# Patient Record
Sex: Female | Born: 1965 | Race: White | Hispanic: No | Marital: Married | Smoking: Current every day smoker
Health system: Southern US, Community
[De-identification: ages and names within clinical notes are randomized; demographics above are authoritative.]

## PROBLEM LIST (undated history)

## (undated) DIAGNOSIS — E785 Hyperlipidemia, unspecified: Secondary | ICD-10-CM

## (undated) DIAGNOSIS — M1990 Unspecified osteoarthritis, unspecified site: Secondary | ICD-10-CM

## (undated) DIAGNOSIS — E079 Disorder of thyroid, unspecified: Secondary | ICD-10-CM

## (undated) DIAGNOSIS — Z720 Tobacco use: Secondary | ICD-10-CM

## (undated) DIAGNOSIS — K219 Gastro-esophageal reflux disease without esophagitis: Secondary | ICD-10-CM

## (undated) DIAGNOSIS — U071 COVID-19: Secondary | ICD-10-CM

## (undated) DIAGNOSIS — F419 Anxiety disorder, unspecified: Secondary | ICD-10-CM

## (undated) HISTORY — DX: Hyperlipidemia, unspecified: E78.5

## (undated) HISTORY — DX: COVID-19: U07.1

## (undated) HISTORY — PX: OVARIAN CYST REMOVAL: SHX89

## (undated) HISTORY — DX: Disorder of thyroid, unspecified: E07.9

## (undated) HISTORY — DX: Gastro-esophageal reflux disease without esophagitis: K21.9

## (undated) HISTORY — PX: BREAST BIOPSY: SHX20

## (undated) HISTORY — DX: Tobacco use: Z72.0

## (undated) HISTORY — DX: Unspecified osteoarthritis, unspecified site: M19.90

## (undated) HISTORY — DX: Anxiety disorder, unspecified: F41.9

---

## 1998-09-11 HISTORY — PX: BREAST SURGERY: SHX581

## 2004-06-23 ENCOUNTER — Ambulatory Visit: Payer: Self-pay

## 2007-09-12 HISTORY — PX: SALPINGOOPHORECTOMY: SHX82

## 2008-02-17 ENCOUNTER — Ambulatory Visit: Payer: Self-pay

## 2008-02-18 ENCOUNTER — Ambulatory Visit: Payer: Self-pay

## 2008-02-20 ENCOUNTER — Emergency Department: Payer: Self-pay | Admitting: Emergency Medicine

## 2012-03-27 LAB — HM PAP SMEAR: HM PAP: NEGATIVE

## 2013-05-08 ENCOUNTER — Ambulatory Visit (INDEPENDENT_AMBULATORY_CARE_PROVIDER_SITE_OTHER)
Admission: RE | Admit: 2013-05-08 | Discharge: 2013-05-08 | Disposition: A | Discharge status: Home / Self Care | Payer: BC Managed Care – PPO | Source: Ambulatory Visit | Attending: Adult Health | Admitting: Adult Health

## 2013-05-08 ENCOUNTER — Encounter: Payer: Self-pay | Admitting: Adult Health

## 2013-05-08 ENCOUNTER — Ambulatory Visit (INDEPENDENT_AMBULATORY_CARE_PROVIDER_SITE_OTHER): Payer: BC Managed Care – PPO | Admitting: Adult Health

## 2013-05-08 VITALS — BP 106/60 | HR 93 | Temp 98.3°F | Resp 12 | Ht 64.5 in | Wt 130.5 lb

## 2013-05-08 DIAGNOSIS — Z7189 Other specified counseling: Secondary | ICD-10-CM

## 2013-05-08 DIAGNOSIS — Z0000 Encounter for general adult medical examination without abnormal findings: Secondary | ICD-10-CM

## 2013-05-08 DIAGNOSIS — Z716 Tobacco abuse counseling: Secondary | ICD-10-CM

## 2013-05-08 DIAGNOSIS — R202 Paresthesia of skin: Secondary | ICD-10-CM

## 2013-05-08 DIAGNOSIS — M25519 Pain in unspecified shoulder: Secondary | ICD-10-CM

## 2013-05-08 DIAGNOSIS — R200 Anesthesia of skin: Secondary | ICD-10-CM

## 2013-05-08 DIAGNOSIS — M25511 Pain in right shoulder: Secondary | ICD-10-CM

## 2013-05-08 DIAGNOSIS — R209 Unspecified disturbances of skin sensation: Secondary | ICD-10-CM

## 2013-05-08 HISTORY — DX: Anesthesia of skin: R20.0

## 2013-05-08 HISTORY — DX: Anesthesia of skin: R20.2

## 2013-05-08 HISTORY — DX: Encounter for general adult medical examination without abnormal findings: Z00.00

## 2013-05-08 HISTORY — DX: Pain in right shoulder: M25.511

## 2013-05-08 HISTORY — DX: Tobacco abuse counseling: Z71.6

## 2013-05-08 LAB — CBC WITH DIFFERENTIAL/PLATELET
Basophils Relative: 0.3 % (ref 0.0–3.0)
Eosinophils Relative: 0.9 % (ref 0.0–5.0)
HCT: 39.5 % (ref 36.0–46.0)
Lymphs Abs: 1.8 10*3/uL (ref 0.7–4.0)
MCV: 97 fl (ref 78.0–100.0)
Monocytes Absolute: 0.7 10*3/uL (ref 0.1–1.0)
Platelets: 218 10*3/uL (ref 150.0–400.0)
WBC: 12.1 10*3/uL — ABNORMAL HIGH (ref 4.5–10.5)

## 2013-05-08 LAB — LIPID PANEL
Cholesterol: 180 mg/dL (ref 0–200)
LDL Cholesterol: 117 mg/dL — ABNORMAL HIGH (ref 0–99)
Total CHOL/HDL Ratio: 4

## 2013-05-08 LAB — BASIC METABOLIC PANEL
Chloride: 103 mEq/L (ref 96–112)
Potassium: 4.3 mEq/L (ref 3.5–5.1)

## 2013-05-08 LAB — HEPATIC FUNCTION PANEL
ALT: 12 U/L (ref 0–35)
AST: 14 U/L (ref 0–37)
Alkaline Phosphatase: 50 U/L (ref 39–117)
Bilirubin, Direct: 0.1 mg/dL (ref 0.0–0.3)
Total Bilirubin: 0.5 mg/dL (ref 0.3–1.2)

## 2013-05-08 IMAGING — CR DG SHOULDER 2+V*R*
3 series · 3 of 3 positions shown · non-contrast
Comparison: None.

CLINICAL DATA: Right shoulder pain for 8 months, no known injury

RIGHT SHOULDER - 2+ VIEW

[view not recorded (1 of 3)]
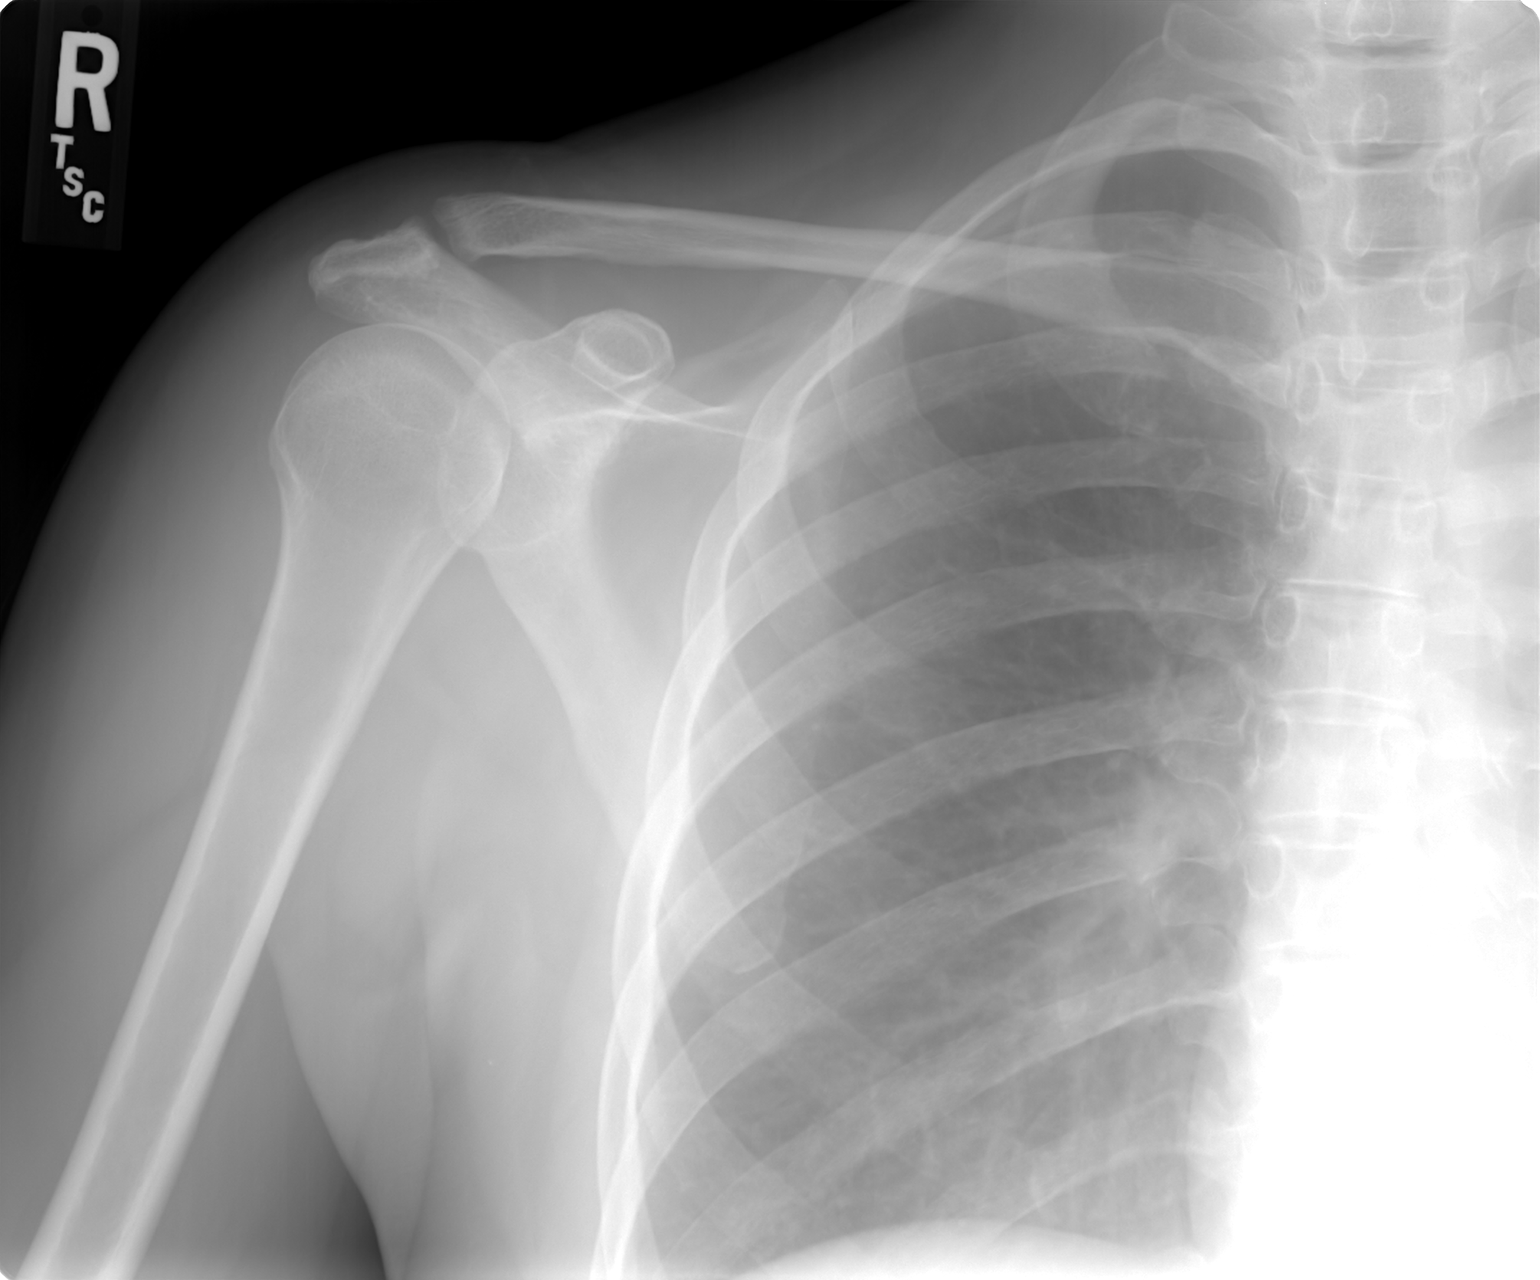

[view not recorded (2 of 3)]
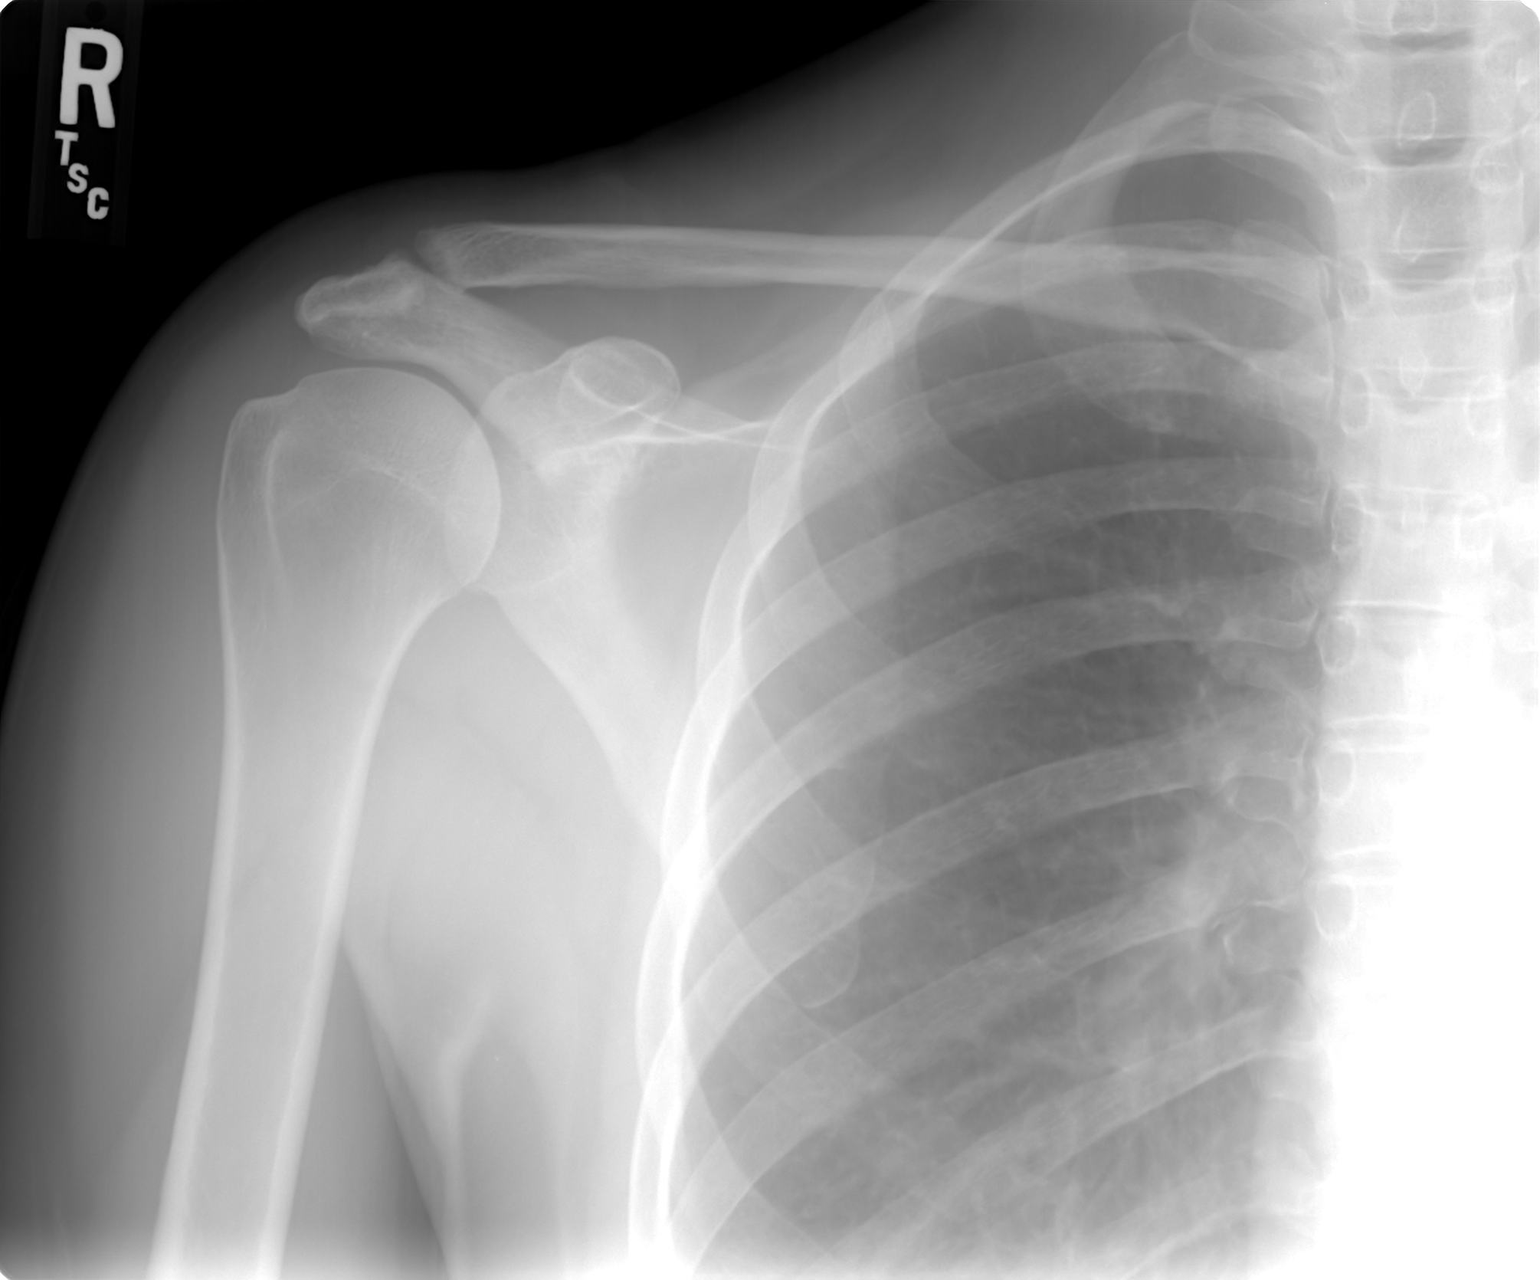

[view not recorded (3 of 3)]
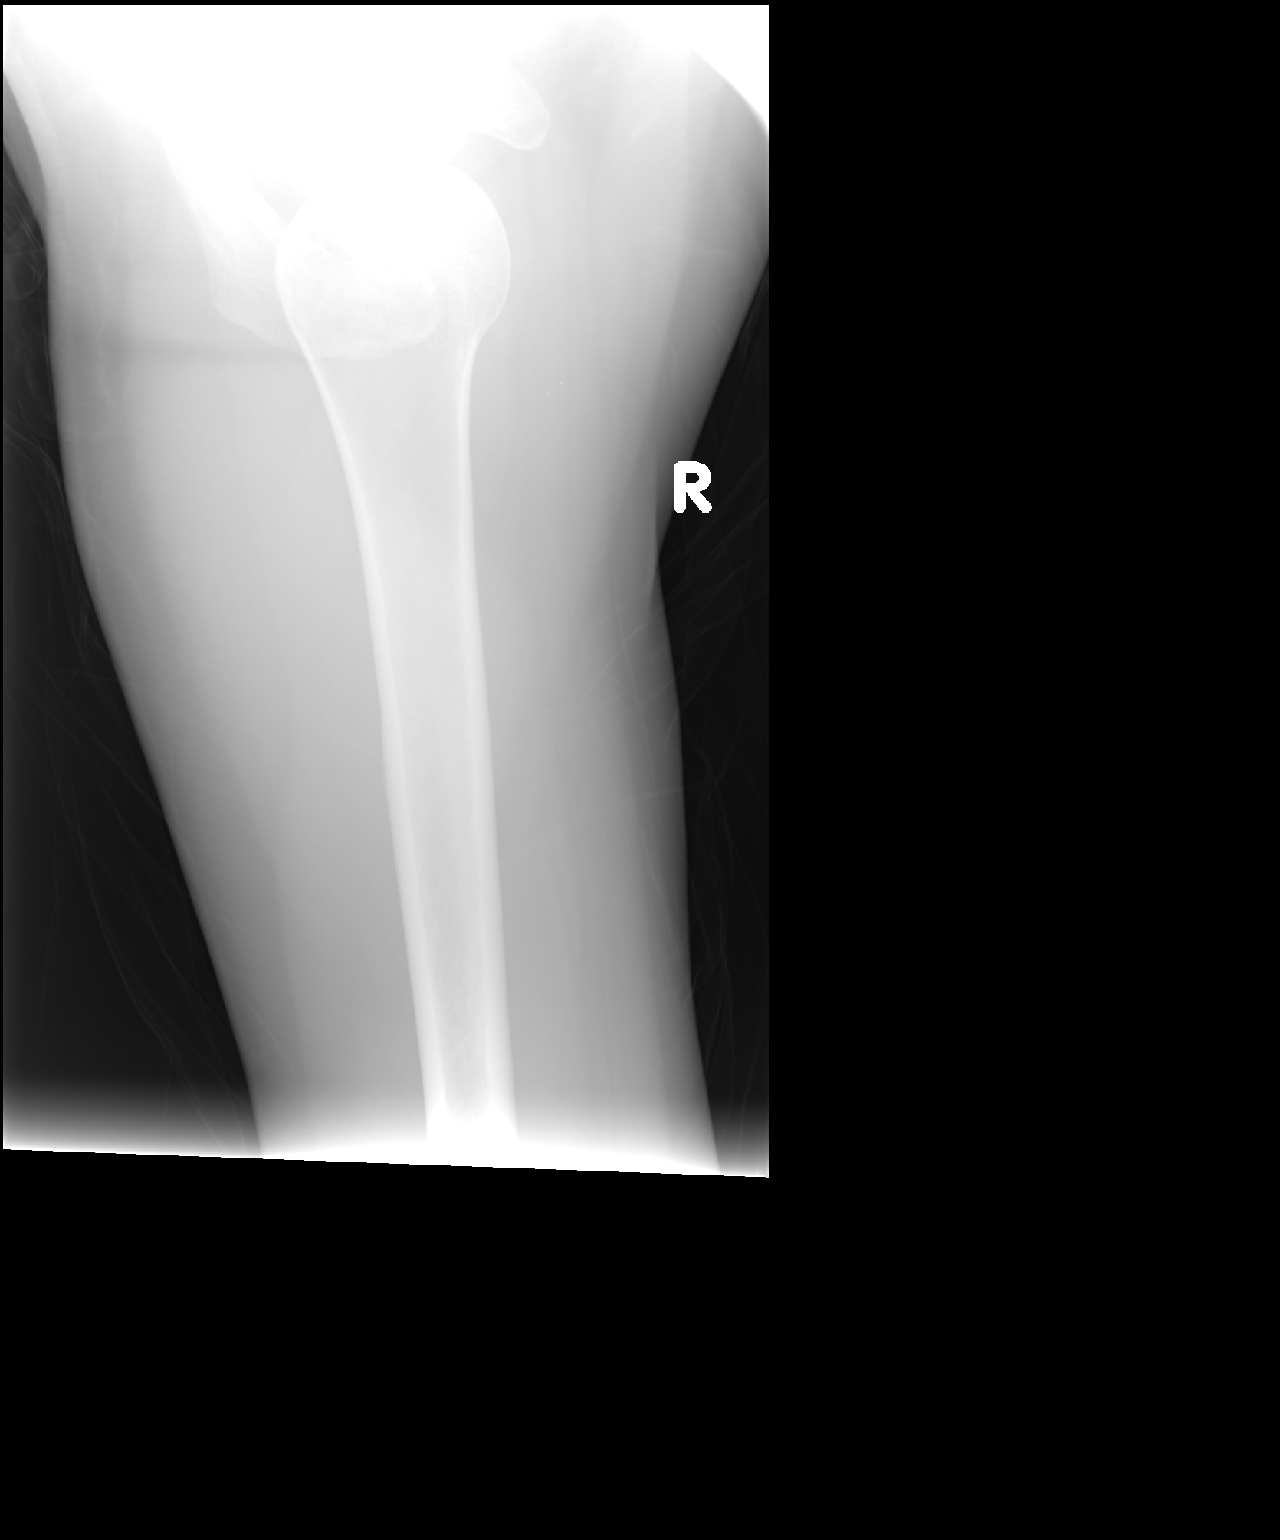

[3 of 3 positions shown; findings below may reference images not displayed]

FINDINGS: Three views of the right shoulder submitted.  No acute
fracture or subluxation.  Glenohumeral joint is preserved.  Minimal
degenerative changes acromioclavicular joint.
IMPRESSION: No acute fracture or subluxation.  Minimal degenerative changes
acromioclavicular joint.

## 2013-05-08 MED ORDER — VARENICLINE TARTRATE 0.5 MG X 11 & 1 MG X 42 PO MISC: ORAL | Status: DC

## 2013-05-08 NOTE — Assessment & Plan Note (Signed)
Discussed importance of smoking cessation for overall health. She is ready to quit. Provided prescription for Chantix. Continue to encourage.

## 2013-05-08 NOTE — Progress Notes (Signed)
  Subjective:    Patient ID: Megan Poole, female    DOB: May 17, 1966, 47 y.o.   MRN: [id number]  HPI  Correction: numbness and tingling of the left upper extremity  Review of Systems  Neurological: Positive for numbness.       Numbness and tingling of the left upper extremity. Sometimes numbness of the index finger.       Objective:   Physical Exam        Assessment & Plan:

## 2013-05-08 NOTE — Patient Instructions (Addendum)
   Thank you for choosing [facility] for your health care needs.  Please have your labs drawn prior to leaving the office.  The results will be available through MyChart for your convenience. Please remember to activate this. The activation code is located at the end of this form.  Please go to our [facility] for your right shoulder xray. I will notify you of results once they are available.  Call [facility] to find out when your mammogram is due.  I am providing you with prescription to Chantix  I will request your medical records from Dr. Luella Cook.  Please call with any questions or concerns.  Bursitis Bursitis is a swelling and soreness (inflammation) of a fluid-filled sac (bursa) that overlies and protects a joint. It can be caused by injury, overuse of the joint, arthritis or infection. The joints most likely to be affected are the elbows, shoulders, hips and knees. HOME CARE INSTRUCTIONS   Apply ice to the affected area for 15-20 minutes each hour while awake for 2 days. Put the ice in a plastic bag and place a towel between the bag of ice and your skin.  Rest the injured joint as much as possible, but continue to put the joint through a full range of motion, 4 times per day. (The shoulder joint especially becomes rapidly "frozen" if not used.) When the pain lessens, begin normal slow movements and usual activities.  Only take over-the-counter or prescription medicines for pain, discomfort or fever as directed by your caregiver.  Your caregiver may recommend draining the bursa and injecting medicine into the bursa. This may help the healing process.  Follow all instructions for follow-up with your caregiver. This includes any orthopedic referrals, physical therapy and rehabilitation. Any delay in obtaining necessary care could result in a delay or failure of the bursitis to heal and chronic pain. SEEK IMMEDIATE MEDICAL CARE IF:   Your pain  increases even during treatment.  You develop an oral temperature above 102 F (38.9 C) and have heat and inflammation over the involved bursa. MAKE SURE YOU:   Understand these instructions.  Will watch your condition.  Will get help right away if you are not doing well or get worse. Document Released: 08/25/2000 Document Revised: 11/20/2011 Document Reviewed: 07/30/2009 [facility] Patient Information 2014 [place], [place].

## 2013-05-08 NOTE — Assessment & Plan Note (Addendum)
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 Negative Tinel and Phalen. Appears to be radial nerve involvement. Uncertain if just at elbow or if C6 may be impacting. Refer to Neurology for EMG testing.

## 2013-05-08 NOTE — Assessment & Plan Note (Addendum)
Normal physical exam excluding pelvic and breast other than problems stated separately. Labs: cbc w/diff, bmet, hepatic panel, tsh, B12, vit d, lipids. Request previous medical records.

## 2013-05-08 NOTE — Progress Notes (Signed)
Subjective:    Patient ID: Megan Poole, female    DOB: Jan 27, 1966, 47 y.o.   MRN: [id number]  HPI  Patient is a pleasant 47 y/o female who presents to clinic to establish care. She has not had a PCP in several years. She was mainly followed by her OB/GYN, Dr. Luella Cook. We will request her medical records. Overall she is feeling well. She has a few concerns.  1. She has been experiencing right shoulder pain since the beginning of the year. She reports the pain is worse in the evening. She has not had this evaluated. ROM is decreased. She reports pain with lifting arm forward. She does not recall any injury to the area that she can recall. She rides horses. Pt is right handed. The pain is localized to the shoulder area. Occasionally takes ibuprofen. Denies tingling or numbness of the right arm.   2. She has left lower extremity tingling and numbness. This does not wake her up at night. 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 She occasionally feels numbness in the index finger. Denies swelling, redness or other symptom. Pt reports tightness in the neck area - no pain.   Review of Systems  Constitutional: Negative for fever, chills and fatigue.  HENT: Negative.   Eyes: Negative.        Wears reading glasses. Need eye exam. Has not had dilated exam in years.  Respiratory: Negative for cough, chest tightness, shortness of breath and wheezing.   Cardiovascular: Negative for palpitations and leg swelling.       Has felt a burning and heaviness in her chest. Lasted ~ 15 min. She did not recall pain elsewhere at the time.  Gastrointestinal: Negative.   Endocrine: Negative.   Genitourinary: Negative for dysuria, urgency, frequency, hematuria, flank pain, difficulty urinating, vaginal pain, menstrual problem and pelvic pain.  Musculoskeletal:       Right shoulder constant pain since January 2014. Aches worse at the end of the day. She does not recall  any injury to the area. She has full range of motion but cannot move it quickly.  Skin: Negative.   Allergic/Immunologic:       Seasonal allergies - may trigger some sinus "problems". Mainly during spring and summer.  Neurological: Positive for numbness. Negative for dizziness, tremors, seizures, syncope, weakness, light-headedness and headaches.       Tingling and numbness of left lower extremity. Sometimes feels the index finger go numb. Feels tingling constant. Does not wake her up at night.   Hematological: Negative.   Psychiatric/Behavioral: Negative.     BP 106/60  Pulse 93  Temp(Src) 98.3 F (36.8 C) (Oral)  Resp 12  Ht 5' 4.5" (1.638 m)  Wt 130 lb 8 oz (59.194 kg)  BMI 22.06 kg/m2  SpO2 99%  LMP 05/07/2013    Objective:   Physical Exam  Constitutional: She is oriented to person, place, and time. She appears well-developed and well-nourished. No distress.  HENT:  Head: Normocephalic and atraumatic.  Right Ear: External ear normal.  Left Ear: External ear normal.  Nose: Nose normal.  Mouth/Throat: Oropharynx is clear and moist.  Eyes: Conjunctivae and EOM are normal. Pupils are equal, round, and reactive to light.  Neck: Normal range of motion. Neck supple. No tracheal deviation present. No thyromegaly present.  Cardiovascular: Normal rate, regular rhythm, normal heart sounds and intact distal pulses.  Exam reveals no gallop and no friction rub.   No  murmur heard. Pulmonary/Chest: Effort normal and breath sounds normal. No respiratory distress. She has no wheezes. She has no rales. She exhibits no tenderness.  Abdominal: Soft. Bowel sounds are normal. She exhibits no distension and no mass. There is no tenderness. There is no rebound and no guarding.  Musculoskeletal: Normal range of motion. She exhibits no edema and no tenderness.  Lymphadenopathy:    She has no cervical adenopathy.  Neurological: She is alert and oriented to person, place, and time. She has normal  reflexes. She displays normal reflexes. No cranial nerve deficit. She exhibits normal muscle tone. Coordination normal.  Reflex Scores:      Brachioradialis reflexes are 2+ on the right side and 2+ on the left side.      Patellar reflexes are 2+ on the right side and 2+ on the left side. Negative Tinel & Phalen  Skin: Skin is warm and dry.  Psychiatric: She has a normal mood and affect. Her behavior is normal. Judgment and thought content normal.       Assessment & Plan:

## 2013-05-08 NOTE — Assessment & Plan Note (Addendum)
Suspect this is bursitis. Check xray to r/o any other bony abnormality. Ice, rest, NSAIDs. If no improvement will refer to ortho.

## 2013-05-09 ENCOUNTER — Encounter: Payer: Self-pay | Admitting: *Deleted

## 2013-10-09 ENCOUNTER — Ambulatory Visit (INDEPENDENT_AMBULATORY_CARE_PROVIDER_SITE_OTHER): Payer: BC Managed Care – PPO | Admitting: Adult Health

## 2013-10-09 ENCOUNTER — Encounter: Payer: Self-pay | Admitting: Adult Health

## 2013-10-09 VITALS — BP 108/70 | HR 96 | Resp 12 | Wt 133.0 lb

## 2013-10-09 DIAGNOSIS — R079 Chest pain, unspecified: Secondary | ICD-10-CM

## 2013-10-09 NOTE — Assessment & Plan Note (Signed)
[facility] that occurred around 2 am. Workup was normal for cardiac. However, pt has a family hx of CAD (father) and she has a 32 pack year hx of smoking. Her LDL was slightly elevated. Discussed having stress test. Will refer to Dr. Kirke CorinArida or Dr. Mariah MillingGollan for further evaluation and recommendations. Pt is active but does not exercise.

## 2013-10-09 NOTE — Progress Notes (Signed)
Pre visit review using our clinic review tool, if applicable. No additional management support is needed unless otherwise documented below in the visit note. 

## 2013-10-09 NOTE — Progress Notes (Signed)
   Subjective:    Patient ID: Megan Poole, female    DOB: February 28, 1966, 48 y.o.   MRN: [id number]  HPI  Pt is a pleasant 48 y/o female who [facility] for mid sternal chest pain. She reports pain started around 2 am. After workup was negative for cardiac, it was determined to be M/S in nature. Also has some stomach problems occasionally with reflux. May have also been spasms of the esophagus. Symptoms have subsided completely. Recommendation was to follow up with PCP and possible stress test. She does not have any personal cardiac hx but does have strong family hx of cardiac dz. Ongoing tobacco abuse. She has a 32 year pack history. LDL was slightly elevated. She is not overweight.   Review of Systems  Constitutional: Negative.   HENT: Negative.   Respiratory: Negative.   Cardiovascular: Negative for chest pain, palpitations and leg swelling.  Gastrointestinal:       Occasional reflux   Genitourinary: Negative.   Neurological: Negative.   Hematological: Negative.   Psychiatric/Behavioral: Negative.   All other systems reviewed and are negative.       Objective:   Physical Exam  Constitutional: She is oriented to person, place, and time. She appears well-developed and well-nourished. No distress.  Cardiovascular: Normal rate, regular rhythm and normal heart sounds.  Exam reveals no gallop and no friction rub.   No murmur heard. Pulmonary/Chest: Effort normal and breath sounds normal. No respiratory distress. She has no wheezes. She has no rales. She exhibits no tenderness.  Neurological: She is alert and oriented to person, place, and time.  Skin: Skin is warm and dry.  Psychiatric: She has a normal mood and affect. Her behavior is normal. Judgment and thought content normal.          Assessment & Plan:

## 2013-10-10 ENCOUNTER — Telehealth: Payer: Self-pay | Admitting: Adult Health

## 2013-10-10 NOTE — Telephone Encounter (Signed)
Relevant patient education assigned to patient using Emmi. ° °

## 2013-10-15 ENCOUNTER — Encounter: Payer: Self-pay | Admitting: Cardiovascular Disease

## 2013-10-15 ENCOUNTER — Ambulatory Visit (INDEPENDENT_AMBULATORY_CARE_PROVIDER_SITE_OTHER): Payer: BC Managed Care – PPO | Admitting: Cardiovascular Disease

## 2013-10-15 VITALS — BP 100/78 | HR 96 | Ht 66.0 in | Wt 135.5 lb

## 2013-10-15 DIAGNOSIS — R079 Chest pain, unspecified: Secondary | ICD-10-CM

## 2013-10-15 NOTE — Patient Instructions (Signed)
Your physician recommends that you schedule a follow-up appointment in: 3 months.  

## 2013-10-15 NOTE — Assessment & Plan Note (Signed)
She presents with several episodes of chest pain that occurred 2-3 weeks ago. The pains have  not recurred. The chest pains were somewhat atypical in that it woke her up in the middle of the night. She went to the emergency room and her workup was unremarkable. She's not had any recurrent pain.  She was diagnosed as having MSK pain   She does smoke and has a positive family history of coronary disease.  At this point, I do not think that a stress test would show us much. Her resting EKG is normal. She does not have any exertional symptoms.  I have advised her to start a regular exercise program. If she is able to progress in her exercise regimen, then I do not think that we need to do a stress test.  On the other hand, if she develops episodes of chest pain or chest tightness with any sort of exercise regimen, then we should proceed with further evaluation for ischemia.  Her father has a history of coronary artery disease and has had several PCI's. He also has a history of lung cancer. I strongly encouraged her to stop smoking.

## 2013-10-15 NOTE — Progress Notes (Signed)
     Megan Poole Date of Birth  [birth year]       [facility]    [place] [facility] [address], [address]  [address] [place], [place]  [address]   [place]  [id number] [phone]     [phone]   Fax  [phone]    Fax [phone]  Problem List: 1. Chest pain  History of Present Illness:  Megan Poole presents for further eval of CP.  Occurred Jan. 18 and 19th.  Woke her up in the middle of the night.  2 Am.   The pain last 10 minutes.    Went to ER.  Evaluation was negative.    1 ppd smoker.    She is active - does not do regular exercise.  She has not cut back on her smoking yet.   She has not started chantix yet.  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   + family hx of CAD - father with CAD, smoker.  Also has lung cancer.     Current Outpatient Prescriptions  Medication Sig Dispense Refill  . ibuprofen (ADVIL,MOTRIN) 200 MG tablet Take 400 mg by mouth every 6 (six) hours as needed.      . CHANTIX STARTING MONTH PAK 0.5 MG X 11 & 1 MG X 42 tablet Take by mouth as needed.        No current facility-administered medications for this visit.     No Known Allergies  History reviewed. No pertinent past medical history.  Past Surgical History  Procedure Laterality Date  . Breast surgery Right 2000    biopsy - benign  . Salpingoophorectomy Right 2009    ovarian cyst    History  Smoking status  . Current Every Day Smoker -- 1.00 packs/day for 30 years  . Types: Cigarettes  Smokeless tobacco  . Never Used    History  Alcohol Use No    Comment: occasional use    Family History  Problem Relation Age of Onset  . Heart disease Father [id number]    CAD   . Cancer Father     lung  . Cancer Sister     cervical cancer  . Hypertension Paternal Grandmother     Reviw of Systems:  Reviewed in the HPI.  All other systems are negative.  Physical Exam: Blood pressure 100/78, pulse 96, height 5\' 6"  (1.676 m), weight 135 lb 8 oz (61.462  kg). Wt Readings from Last 3 Encounters:  10/15/13 135 lb 8 oz (61.462 kg)  10/09/13 133 lb (60.328 kg)  05/08/13 130 lb 8 oz (59.194 kg)     General: Well developed, well nourished, in no acute distress.  Head: Normocephalic, atraumatic, sclera non-icteric, mucus membranes are moist,   Neck: Supple. Carotids are 2 + without bruits. No JVD   Lungs: Clear   Heart: RR, normal S1, S2  Abdomen: Soft, non-tender, non-distended with normal bowel sounds.  Msk:  Strength and tone are normal   Extremities: No clubbing or cyanosis. No edema.  Distal pedal pulses are 2+ and equal    Neuro: CN II - XII intact.  Alert and oriented X 3.   Psych:  Normal   ECG: 10/15/2013: Normal sinus rhythm at 96. EKG is normal.  Assessment / Plan:

## 2014-01-19 ENCOUNTER — Ambulatory Visit: Payer: BC Managed Care – PPO | Admitting: Cardiovascular Disease

## 2014-02-25 ENCOUNTER — Encounter: Payer: Self-pay | Admitting: *Deleted

## 2015-05-14 ENCOUNTER — Encounter (INDEPENDENT_AMBULATORY_CARE_PROVIDER_SITE_OTHER): Payer: Self-pay

## 2015-05-14 ENCOUNTER — Ambulatory Visit (INDEPENDENT_AMBULATORY_CARE_PROVIDER_SITE_OTHER): Payer: BC Managed Care – PPO | Admitting: Nurse Practitioner

## 2015-05-14 ENCOUNTER — Encounter: Payer: Self-pay | Admitting: Nurse Practitioner

## 2015-05-14 VITALS — BP 100/80 | HR 96 | Temp 98.3°F | Resp 14 | Ht 66.0 in | Wt 138.4 lb

## 2015-05-14 DIAGNOSIS — Z1329 Encounter for screening for other suspected endocrine disorder: Secondary | ICD-10-CM

## 2015-05-14 DIAGNOSIS — Z130 Encounter for screening for diseases of the blood and blood-forming organs and certain disorders involving the immune mechanism: Secondary | ICD-10-CM

## 2015-05-14 DIAGNOSIS — Z1389 Encounter for screening for other disorder: Secondary | ICD-10-CM

## 2015-05-14 DIAGNOSIS — Z720 Tobacco use: Secondary | ICD-10-CM | POA: Diagnosis not present

## 2015-05-14 DIAGNOSIS — Z716 Tobacco abuse counseling: Secondary | ICD-10-CM

## 2015-05-14 DIAGNOSIS — Z131 Encounter for screening for diabetes mellitus: Secondary | ICD-10-CM | POA: Diagnosis not present

## 2015-05-14 DIAGNOSIS — Z13220 Encounter for screening for lipoid disorders: Secondary | ICD-10-CM

## 2015-05-14 LAB — CBC WITH DIFFERENTIAL/PLATELET
BASOS ABS: 0 10*3/uL (ref 0.0–0.1)
BASOS PCT: 0.2 % (ref 0.0–3.0)
EOS ABS: 0.1 10*3/uL (ref 0.0–0.7)
Eosinophils Relative: 1.2 % (ref 0.0–5.0)
HEMATOCRIT: 39.8 % (ref 36.0–46.0)
Hemoglobin: 13.2 g/dL (ref 12.0–15.0)
LYMPHS ABS: 3 10*3/uL (ref 0.7–4.0)
Lymphocytes Relative: 29.2 % (ref 12.0–46.0)
MCHC: 33.2 g/dL (ref 30.0–36.0)
MCV: 95.3 fl (ref 78.0–100.0)
MONO ABS: 0.8 10*3/uL (ref 0.1–1.0)
Monocytes Relative: 7.6 % (ref 3.0–12.0)
NEUTROS ABS: 6.3 10*3/uL (ref 1.4–7.7)
NEUTROS PCT: 61.8 % (ref 43.0–77.0)
PLATELETS: 248 10*3/uL (ref 150.0–400.0)
RBC: 4.17 Mil/uL (ref 3.87–5.11)
RDW: 13.5 % (ref 11.5–15.5)
WBC: 10.2 10*3/uL (ref 4.0–10.5)

## 2015-05-14 LAB — COMPREHENSIVE METABOLIC PANEL
ALBUMIN: 3.9 g/dL (ref 3.5–5.2)
ALT: 18 U/L (ref 0–35)
AST: 15 U/L (ref 0–37)
Alkaline Phosphatase: 55 U/L (ref 39–117)
BUN: 9 mg/dL (ref 6–23)
CALCIUM: 9.3 mg/dL (ref 8.4–10.5)
CHLORIDE: 107 meq/L (ref 96–112)
CO2: 28 mEq/L (ref 19–32)
CREATININE: 0.6 mg/dL (ref 0.40–1.20)
GFR: 112.5 mL/min (ref >60.00)
Glucose, Bld: 76 mg/dL (ref 70–99)
POTASSIUM: 4.3 meq/L (ref 3.5–5.1)
Sodium: 140 mEq/L (ref 135–145)
Total Bilirubin: 0.2 mg/dL (ref 0.2–1.2)
Total Protein: 6.7 g/dL (ref 6.0–8.3)

## 2015-05-14 LAB — LIPID PANEL
CHOL/HDL RATIO: 5
CHOLESTEROL: 182 mg/dL (ref 0–200)
HDL: 37.4 mg/dL — ABNORMAL LOW (ref >39.00)
NonHDL: 144.4
TRIGLYCERIDES: 258 mg/dL — AB (ref 0.0–149.0)
VLDL: 51.6 mg/dL — AB (ref 0.0–40.0)

## 2015-05-14 LAB — HEMOGLOBIN A1C: HEMOGLOBIN A1C: 5.3 % (ref 4.6–6.5)

## 2015-05-14 MED ORDER — VARENICLINE TARTRATE 0.5 MG X 11 & 1 MG X 42 PO MISC: ORAL | Status: DC

## 2015-05-14 MED ORDER — VARENICLINE TARTRATE 1 MG PO TABS: 1.0000 mg | ORAL_TABLET | Freq: Two times a day (BID) | ORAL | Status: DC

## 2015-05-14 MED ORDER — VARENICLINE TARTRATE 0.5 MG X 11 & 1 MG X 42 PO MISC
ORAL | Status: DC
Start: 2015-05-14 — End: 2015-05-14

## 2015-05-14 NOTE — Progress Notes (Signed)
Patient ID: Megan Poole, female    DOB: 1966-08-27  Age: 49 y.o. MRN: [id number]  CC: Establish Care   HPI Megan Poole presents for CC of quitting smoking and getting lab. Former Conservation officer, [employer].   1) Tobacco Use- Used Chantix starter pack, then she started smoking again patient would like to try starter pack and then continuing pack. She has not used any other forms in past. She denies any adverse effects with the Chantix the first go round. She is motivated to stop smoking due to family history of lung cancer.  2) Labs- would like to have his labs drawn since no labs in 2014   History Megan Poole has no past medical history on file.   She has past surgical history that includes Breast surgery (Right, 2000) and Salpingoophorectomy (Right, 2009).   Her family history includes Cancer in her father and sister; Heart disease (age of onset: 67) in her father; Hypertension in her paternal grandmother.She reports that she has been smoking Cigarettes.  She has a 30 pack-year smoking history. She has never used smokeless tobacco. She reports that she does not drink alcohol or use illicit drugs.  Outpatient Prescriptions Prior to Visit  Medication Sig Dispense Refill  . ibuprofen (ADVIL,MOTRIN) 200 MG tablet Take 400 mg by mouth every 6 (six) hours as needed.    . CHANTIX STARTING MONTH PAK 0.5 MG X 11 & 1 MG X 42 tablet Take by mouth as needed.      No facility-administered medications prior to visit.    ROS Review of Systems  Constitutional: Negative for fever, chills, diaphoresis and fatigue.  Respiratory: Negative for chest tightness, shortness of breath and wheezing.   Cardiovascular: Negative for chest pain, palpitations and leg swelling.  Gastrointestinal: Negative for nausea, vomiting and diarrhea.  Skin: Negative for rash.  Neurological: Negative for dizziness, weakness, numbness and headaches.  Psychiatric/Behavioral: The patient is not nervous/anxious.     Objective:  BP 100/80  mmHg  Pulse 96  Temp(Src) 98.3 F (36.8 C)  Resp 14  Ht  (1.676 m)  Wt 138 lb 6.4 oz (62.778 kg)  BMI 22.35 kg/m2  SpO2 95%  Physical Exam  Constitutional: She is oriented to person, place, and time. She appears well-developed and well-nourished. No distress.  HENT:  Head: Normocephalic and atraumatic.  Right Ear: External ear normal.  Left Ear: External ear normal.  Neurological: She is alert and oriented to person, place, and time. No cranial nerve deficit. She exhibits normal muscle tone. Coordination normal.  Skin: Skin is warm and dry. No rash noted. She is not diaphoretic.  Psychiatric: She has a normal mood and affect. Her behavior is normal. Judgment and thought content normal.    Assessment & Plan:   Megan Poole was seen today for establish care.  Diagnoses and all orders for this visit:  Encounter for smoking cessation counseling  Screening for deficiency anemia -     CBC with Differential/Platelet  Screening for nephropathy -     Cancel: Comprehensive metabolic panel  Screening for hyperlipidemia -     Cancel: Lipid panel  Screening for thyroid disorder -     Cancel: TSH  Screening for diabetes mellitus -     Hemoglobin A1c  Other orders -     Discontinue: varenicline (CHANTIX PAK) 0.5 MG X 11 & 1 MG X 42 tablet; Take as directed -     varenicline (CHANTIX CONTINUING MONTH PAK) 1 MG tablet; Take 1 tablet (1  mg total) by mouth 2 (two) times daily. -     varenicline (CHANTIX PAK) 0.5 MG X 11 & 1 MG X 42 tablet; Take as directed -     Comprehensive metabolic panel -     Lipid panel -     TSH -     LDL cholesterol, direct   I have discontinued Megan Poole's CHANTIX STARTING MONTH PAK. I am also having her start on varenicline. Additionally, I am having her maintain her ibuprofen and varenicline.  Meds ordered this encounter  Medications  . DISCONTD: varenicline (CHANTIX PAK) 0.5 MG X 11 & 1 MG X 42 tablet    Sig: Take as directed    Dispense:  53  tablet    Refill:  0    Order Specific Question:  Supervising Provider    Answer:  Duncan Dull L [[id number]]  . varenicline (CHANTIX CONTINUING MONTH PAK) 1 MG tablet    Sig: Take 1 tablet (1 mg total) by mouth 2 (two) times daily.    Dispense:  154 tablet    Refill:  0    Order Specific Question:  Supervising Provider    Answer:  Duncan Dull L [[id number]]  . varenicline (CHANTIX PAK) 0.5 MG X 11 & 1 MG X 42 tablet    Sig: Take as directed    Dispense:  53 tablet    Refill:  0    Order Specific Question:  Supervising Provider    Answer:  Sherlene Shams [[id number]]     Follow-up: Return in about 3 months (around 08/13/2015) for smoking cessation.

## 2015-05-14 NOTE — Patient Instructions (Addendum)
Take one 0.5 mg tablet by mouth once daily for 3 days   Then increase to one 0.5 mg tablet twice daily for 4 days   Then increase to one 1 mg tablet twice daily  When done pick up the continuing pack for 11 weeks. Then DONE!

## 2015-05-14 NOTE — Progress Notes (Signed)
Pre visit review using our clinic review tool, if applicable. No additional management support is needed unless otherwise documented below in the visit note. 

## 2015-05-15 LAB — LDL CHOLESTEROL, DIRECT: Direct LDL: 114 mg/dL

## 2015-05-15 LAB — TSH: TSH: 0.12 u[IU]/mL — ABNORMAL LOW (ref 0.35–4.50)

## 2015-05-30 NOTE — Assessment & Plan Note (Signed)
Patient interested in stopping smoking once again. Discussed options with patient. She would like to start the Chantix again gave patient starting and continuing pack scripts. Patient seems motivated congratulated her on efforts to quit smoking. We'll follow-up in 3 months.

## 2015-06-15 ENCOUNTER — Other Ambulatory Visit: Payer: BC Managed Care – PPO

## 2015-06-15 ENCOUNTER — Encounter: Payer: Self-pay | Admitting: Nurse Practitioner

## 2015-06-15 ENCOUNTER — Ambulatory Visit (INDEPENDENT_AMBULATORY_CARE_PROVIDER_SITE_OTHER): Payer: BC Managed Care – PPO | Admitting: Nurse Practitioner

## 2015-06-15 ENCOUNTER — Encounter (INDEPENDENT_AMBULATORY_CARE_PROVIDER_SITE_OTHER): Payer: Self-pay

## 2015-06-15 VITALS — BP 104/80 | HR 89 | Temp 98.3°F | Resp 16 | Ht 66.0 in | Wt 138.4 lb

## 2015-06-15 DIAGNOSIS — Z716 Tobacco abuse counseling: Secondary | ICD-10-CM | POA: Diagnosis not present

## 2015-06-15 DIAGNOSIS — R946 Abnormal results of thyroid function studies: Secondary | ICD-10-CM

## 2015-06-15 DIAGNOSIS — Z720 Tobacco use: Secondary | ICD-10-CM | POA: Diagnosis not present

## 2015-06-15 DIAGNOSIS — R7989 Other specified abnormal findings of blood chemistry: Secondary | ICD-10-CM

## 2015-06-15 LAB — T4, FREE: FREE T4: 1.55 ng/dL (ref 0.60–1.60)

## 2015-06-15 LAB — TSH: TSH: 0.12 u[IU]/mL — AB (ref 0.35–4.50)

## 2015-06-15 NOTE — Assessment & Plan Note (Addendum)
TSH was low last visit. Will obtain repeat with T4 free today. Pt reports hot flashes. No other symptoms like palpitations and weight loss

## 2015-06-15 NOTE — Progress Notes (Signed)
Patient ID: Megan Poole, female    DOB: February 28, 1966  Age: 49 y.o. MRN: [id number]  CC: Follow-up   HPI BERNADINE MELECIO presents for follow up of smoking cessation and thyroid labs abnormal.  1) Sunday 4 cigarettes, 6 on Monday  Feels like she craves it less  Dreams increased, but no night-mares Breathing feels better  Coughing less  Denies suicidal ideations, headaches, or depression   Pt is concerned about whether she should be done with smoking with starting or when she should be done.  She is slightly light-headed.   2) TSH was low at last visit. Repeating with a T4 free today.   History Liseth has no past medical history on file.   She has past surgical history that includes Breast surgery (Right, 2000) and Salpingoophorectomy (Right, 2009).   Her family history includes Cancer in her father and sister; Heart disease (age of onset: 31) in her father; Hypertension in her paternal grandmother.She reports that she has been smoking Cigarettes.  She has a 30 pack-year smoking history. She has never used smokeless tobacco. She reports that she does not drink alcohol or use illicit drugs.  Outpatient Prescriptions Prior to Visit  Medication Sig Dispense Refill  . ibuprofen (ADVIL,MOTRIN) 200 MG tablet Take 400 mg by mouth every 6 (six) hours as needed.    . varenicline (CHANTIX CONTINUING MONTH PAK) 1 MG tablet Take 1 tablet (1 mg total) by mouth 2 (two) times daily. 154 tablet 0  . varenicline (CHANTIX PAK) 0.5 MG X 11 & 1 MG X 42 tablet Take as directed 53 tablet 0   No facility-administered medications prior to visit.    ROS Review of Systems  Constitutional: Negative for fever, chills, diaphoresis and fatigue.  Respiratory: Negative for chest tightness, shortness of breath and wheezing.   Cardiovascular: Negative for chest pain, palpitations and leg swelling.  Gastrointestinal: Negative for nausea, vomiting and diarrhea.  Neurological: Positive for light-headedness.  Negative for dizziness, weakness, numbness and headaches.  Psychiatric/Behavioral: Negative for suicidal ideas and sleep disturbance. The patient is not nervous/anxious.     Objective:  BP 104/80 mmHg  Pulse 89  Temp(Src) 98.3 F (36.8 C)  Resp 16  Ht  (1.676 m)  Wt 138 lb 6.4 oz (62.778 kg)  BMI 22.35 kg/m2  SpO2 97%  Physical Exam  Constitutional: She is oriented to person, place, and time. She appears well-developed and well-nourished. No distress.  HENT:  Head: Normocephalic and atraumatic.  Right Ear: External ear normal.  Left Ear: External ear normal.  Cardiovascular: Normal rate and regular rhythm.  Exam reveals no gallop and no friction rub.   No murmur heard. Pulmonary/Chest: Effort normal and breath sounds normal. No respiratory distress. She has no wheezes. She has no rales. She exhibits no tenderness.  Neurological: She is alert and oriented to person, place, and time. No cranial nerve deficit. She exhibits normal muscle tone. Coordination normal.  Skin: Skin is warm and dry. No rash noted. She is not diaphoretic.  Psychiatric: She has a normal mood and affect. Her behavior is normal. Judgment and thought content normal.    Assessment & Plan:   Journie was seen today for follow-up.  Diagnoses and all orders for this visit:  Encounter for smoking cessation counseling  Abnormal thyroid blood test -     TSH -     T4, free   I am having Ms. Wadel maintain her ibuprofen, varenicline, and varenicline.  No orders of the  defined types were placed in this encounter.     Follow-up: Return if symptoms worsen or fail to improve, for Has appnt on 08/13/15 .

## 2015-06-15 NOTE — Patient Instructions (Signed)
Keep up the good work... By the 2nd month work on 1-2 cigarettes and 3rd month none.

## 2015-06-15 NOTE — Assessment & Plan Note (Signed)
Tobacco cessation is underway with Chantix. She is still on starter pak day 21. Smoking 4-6 cigarettes daily. According to directions she should go down by 50% each month with cessation by 12 weeks. She was concerned, but happy with this information. She will continue to work on this. FU in 2 months

## 2015-06-15 NOTE — Progress Notes (Signed)
Pre visit review using our clinic review tool, if applicable. No additional management support is needed unless otherwise documented below in the visit note. 

## 2015-06-18 ENCOUNTER — Other Ambulatory Visit: Payer: Self-pay | Admitting: Nurse Practitioner

## 2015-06-18 DIAGNOSIS — E0590 Thyrotoxicosis, unspecified without thyrotoxic crisis or storm: Secondary | ICD-10-CM

## 2015-07-30 ENCOUNTER — Other Ambulatory Visit (INDEPENDENT_AMBULATORY_CARE_PROVIDER_SITE_OTHER): Payer: BC Managed Care – PPO

## 2015-07-30 DIAGNOSIS — E0590 Thyrotoxicosis, unspecified without thyrotoxic crisis or storm: Secondary | ICD-10-CM | POA: Diagnosis not present

## 2015-07-30 LAB — TSH: TSH: 0.08 u[IU]/mL — AB (ref 0.35–4.50)

## 2015-07-30 LAB — T4, FREE: FREE T4: 1.3 ng/dL (ref 0.60–1.60)

## 2015-08-10 ENCOUNTER — Other Ambulatory Visit: Payer: Self-pay | Admitting: Nurse Practitioner

## 2015-08-13 ENCOUNTER — Ambulatory Visit: Payer: BC Managed Care – PPO | Admitting: Nurse Practitioner

## 2015-08-13 ENCOUNTER — Ambulatory Visit (INDEPENDENT_AMBULATORY_CARE_PROVIDER_SITE_OTHER): Payer: BC Managed Care – PPO | Admitting: Nurse Practitioner

## 2015-08-13 ENCOUNTER — Encounter: Payer: Self-pay | Admitting: Nurse Practitioner

## 2015-08-13 VITALS — BP 104/60 | HR 72 | Temp 98.0°F | Wt 126.0 lb

## 2015-08-13 DIAGNOSIS — E0590 Thyrotoxicosis, unspecified without thyrotoxic crisis or storm: Secondary | ICD-10-CM

## 2015-08-13 DIAGNOSIS — Z716 Tobacco abuse counseling: Secondary | ICD-10-CM

## 2015-08-13 DIAGNOSIS — E785 Hyperlipidemia, unspecified: Secondary | ICD-10-CM | POA: Diagnosis not present

## 2015-08-13 DIAGNOSIS — R079 Chest pain, unspecified: Secondary | ICD-10-CM

## 2015-08-13 DIAGNOSIS — R946 Abnormal results of thyroid function studies: Secondary | ICD-10-CM

## 2015-08-13 DIAGNOSIS — Z720 Tobacco use: Secondary | ICD-10-CM

## 2015-08-13 DIAGNOSIS — R7989 Other specified abnormal findings of blood chemistry: Secondary | ICD-10-CM

## 2015-08-13 HISTORY — DX: Thyrotoxicosis, unspecified without thyrotoxic crisis or storm: E05.90

## 2015-08-13 LAB — LIPID PANEL
CHOLESTEROL: 169 mg/dL (ref 0–200)
HDL: 42.8 mg/dL (ref >39.00)
LDL Cholesterol: 111 mg/dL — ABNORMAL HIGH (ref 0–99)
NONHDL: 125.84
Total CHOL/HDL Ratio: 4
Triglycerides: 72 mg/dL (ref 0.0–149.0)
VLDL: 14.4 mg/dL (ref 0.0–40.0)

## 2015-08-13 NOTE — Assessment & Plan Note (Signed)
Referring her to Endocrinology for subclinical hyperthyroidism. Pt is having some symptoms (weight loss semi-intentional) and intermittent chest pains. EKG no ST changes.

## 2015-08-13 NOTE — Assessment & Plan Note (Addendum)
New onset. Referring her to Endocrinology for subclinical hyperthyroidism. Pt is having some symptoms (weight loss semi-intentional) and intermittent chest pains. EKG no ST changes.  TSH trending downward to 0.08 over 3 months T4 Free is steady in the normal range

## 2015-08-13 NOTE — Assessment & Plan Note (Addendum)
Start ASA 81 mg daily, work on lowering cholesterol- re-check lipid profile today.   EKG- NSR with some undetermined changes. Compared with Last EKG in 2015- NSR, but poor quality strip  Sending to Endocrinology. Episodes are minor, relieved by ASA, and intermittent.

## 2015-08-13 NOTE — Progress Notes (Signed)
Patient ID: Megan Poole, female    DOB: August 22, 1966  Age: 49 y.o. MRN: [id number]  CC: Follow-up   HPI Megan Poole presents for follow up of smoking cessation, lab work, and CC of chest pain.  1) Chest pain- twice in Nov. 3 am night before last   Left side of chest, ASA relieved this. Lasted 15-20 min. Heavy weight. Early morning hours- wakes her up, took ASA 325 mg.   2) Lab work thyroid- Subclinical hyperthyroidism, has questions regarding this. Lost approx 12 lbs in 2 months with some effort of changing portion size  3) Smoking- decreased a lot, but not stopped.   Varied due to stress. 3 or less cigarettes daily  Chantix- felt dizzy so she stopped this Would like to try patches.   History Megan Poole has no past medical history on file.   She has past surgical history that includes Breast surgery (Right, 2000) and Salpingoophorectomy (Right, 2009).   Her family history includes Cancer in her father and sister; Heart disease (age of onset: 29) in her father; Hypertension in her paternal grandmother.She reports that she has been smoking Cigarettes.  She has a 3 pack-year smoking history. She has never used smokeless tobacco. She reports that she does not drink alcohol or use illicit drugs.  Outpatient Prescriptions Prior to Visit  Medication Sig Dispense Refill  . ibuprofen (ADVIL,MOTRIN) 200 MG tablet Take 400 mg by mouth every 6 (six) hours as needed.    . varenicline (CHANTIX CONTINUING MONTH PAK) 1 MG tablet Take 1 tablet (1 mg total) by mouth 2 (two) times daily. 154 tablet 0  . varenicline (CHANTIX PAK) 0.5 MG X 11 & 1 MG X 42 tablet Take as directed 53 tablet 0   No facility-administered medications prior to visit.    ROS Review of Systems  Constitutional: Negative for fever, chills, diaphoresis, fatigue and unexpected weight change.  HENT: Negative for tinnitus and trouble swallowing.   Eyes: Negative for visual disturbance.  Respiratory: Negative for chest  tightness, shortness of breath and wheezing.   Cardiovascular: Positive for chest pain. Negative for palpitations and leg swelling.  Gastrointestinal: Negative for nausea, vomiting and diarrhea.  Skin: Negative for rash.  Neurological: Negative for dizziness, weakness, numbness and headaches.  Psychiatric/Behavioral: Negative for suicidal ideas and sleep disturbance. The patient is not nervous/anxious.     Objective:  BP 104/60 mmHg  Pulse 72  Temp(Src) 98 F (36.7 C) (Oral)  Wt 126 lb (57.153 kg)  SpO2 98%  Physical Exam  Constitutional: She is oriented to person, place, and time. She appears well-developed and well-nourished. No distress.  HENT:  Head: Normocephalic and atraumatic.  Right Ear: External ear normal.  Left Ear: External ear normal.  Neck: Normal range of motion. Neck supple. No thyromegaly present.  Cardiovascular: Normal rate, regular rhythm and normal heart sounds.  Exam reveals no gallop and no friction rub.   No murmur heard. Pulmonary/Chest: Effort normal and breath sounds normal. No respiratory distress. She has no wheezes. She has no rales. She exhibits no tenderness.  Lymphadenopathy:    She has no cervical adenopathy.  Neurological: She is alert and oriented to person, place, and time. No cranial nerve deficit. She exhibits normal muscle tone. Coordination normal.  Skin: Skin is warm and dry. No rash noted. She is not diaphoretic.  Psychiatric: She has a normal mood and affect. Her behavior is normal. Judgment and thought content normal.   Assessment & Plan:   Megan Poole was  seen today for follow-up.  Diagnoses and all orders for this visit:  Hyperlipidemia -     Lipid Profile -     EKG 12-Lead  Subclinical hyperthyroidism -     Ambulatory referral to Endocrinology  Abnormal thyroid blood test  Chest pain, unspecified  Encounter for smoking cessation counseling   I have discontinued [name]'s varenicline and varenicline. I am also having  her maintain her ibuprofen.  No orders of the defined types were placed in this encounter.     Follow-up: Return in about 6 months (around 02/11/2016) for Follow up.

## 2015-08-13 NOTE — Patient Instructions (Signed)
Normal EKG.   We will contact you with your referral to Endocrinology.   Please visit the lab before leaving today.  Take 81 mg of aspirin daily and work on fatty food reduction

## 2015-08-13 NOTE — Assessment & Plan Note (Addendum)
Pt will try patches at this time. Failed Chantix due to feeling poorly. Will follow  Congratulated on effort of quitting!

## 2015-09-21 ENCOUNTER — Other Ambulatory Visit: Payer: BC Managed Care – PPO

## 2015-09-30 LAB — HM PAP SMEAR: HM Pap smear: NORMAL

## 2015-09-30 LAB — HM MAMMOGRAPHY

## 2015-10-04 ENCOUNTER — Other Ambulatory Visit: Payer: Self-pay | Admitting: Internal Medicine

## 2015-10-04 DIAGNOSIS — E0590 Thyrotoxicosis, unspecified without thyrotoxic crisis or storm: Secondary | ICD-10-CM

## 2015-10-25 ENCOUNTER — Encounter: Admission: RE | Admit: 2015-10-25 | Payer: BC Managed Care – PPO | Source: Ambulatory Visit

## 2015-10-25 ENCOUNTER — Encounter
Admission: RE | Admit: 2015-10-25 | Discharge: 2015-10-25 | Disposition: A | Discharge status: Home / Self Care | Payer: BC Managed Care – PPO | Source: Ambulatory Visit | Attending: Internal Medicine | Admitting: Internal Medicine

## 2015-10-25 DIAGNOSIS — E0590 Thyrotoxicosis, unspecified without thyrotoxic crisis or storm: Secondary | ICD-10-CM

## 2015-10-25 MED ORDER — SODIUM IODIDE I-123 7.4 MBQ PO CAPS
165.0000 | ORAL_CAPSULE | Freq: Once | ORAL | Status: AC
  Administered 2015-10-25: 157.521 via ORAL
  Filled 2015-10-25: qty 1

## 2015-10-26 ENCOUNTER — Encounter
Admission: RE | Admit: 2015-10-26 | Discharge: 2015-10-26 | Disposition: A | Discharge status: Home / Self Care | Payer: BC Managed Care – PPO | Source: Ambulatory Visit | Attending: Internal Medicine | Admitting: Internal Medicine

## 2015-10-26 ENCOUNTER — Ambulatory Visit: Payer: BC Managed Care – PPO

## 2015-10-26 DIAGNOSIS — E0590 Thyrotoxicosis, unspecified without thyrotoxic crisis or storm: Secondary | ICD-10-CM | POA: Diagnosis not present

## 2015-10-26 IMAGING — NM NM THYROID IMAGING W/ UPTAKE MULTI (4&24 HR)
1 series · 3 of 3 positions shown · non-contrast
Comparison: None.

CLINICAL DATA: Sub clinical hyperthyroidism.  Weight loss.

EXAM:
THYROID SCAN AND UPTAKE - 4 AND 24 HOURS
TECHNIQUE: Following oral administration of I 123 capsule, anterior planar
imaging was acquired at 24 hours. Thyroid uptake was calculated with
a thyroid probe at 4-6 hours and 24 hours.
RADIOPHARMACEUTICALS:  10057.5 uCi I -123

[Series 1000: (id) thyroid scan · 2.40mm/px · 3 of 3 slices shown]
[im 1/3  full-range]
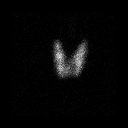
[im 2/3]
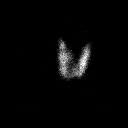
[im 3/3  full-range]
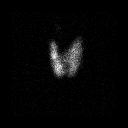

[3 of 3 positions shown; findings below may reference images not displayed]

FINDINGS: Uniform tracer uptake within both lobes of the thyroid gland. No
dominant hot or cold nodule.

6 hour I 123 uptake = 35.8% (normal 5-20%)

24 hour I 123 uptake = 45.8% (normal 10-30%)
IMPRESSION: Elevated 6 hour and 24 hour radioactive iodine uptake. In the
setting of hyperthyroidism findings may be secondary to Grave's
disease.

## 2015-11-04 ENCOUNTER — Encounter: Payer: Self-pay | Admitting: Nurse Practitioner

## 2016-10-05 ENCOUNTER — Other Ambulatory Visit: Payer: Self-pay | Admitting: Internal Medicine

## 2016-10-05 DIAGNOSIS — E0500 Thyrotoxicosis with diffuse goiter without thyrotoxic crisis or storm: Secondary | ICD-10-CM

## 2016-10-31 ENCOUNTER — Encounter
Admission: RE | Admit: 2016-10-31 | Discharge: 2016-10-31 | Disposition: A | Discharge status: Home / Self Care | Payer: BC Managed Care – PPO | Source: Ambulatory Visit | Attending: Internal Medicine | Admitting: Internal Medicine

## 2016-10-31 DIAGNOSIS — E0500 Thyrotoxicosis with diffuse goiter without thyrotoxic crisis or storm: Secondary | ICD-10-CM | POA: Insufficient documentation

## 2016-10-31 IMAGING — NM NM THYROID IMAGING W/ UPTAKE MULTI (4&24 HR)
1 series · 3 of 3 positions shown · non-contrast
Comparison: 10/26/2015

CLINICAL DATA: Graves disease

EXAM:
THYROID SCAN AND UPTAKE - 4 AND 24 HOURS
TECHNIQUE: Following oral administration of V-SFO capsule, anterior planar
imaging was acquired at 24 hours. Thyroid uptake was calculated with
a thyroid probe at 4-6 hours and 24 hours.
RADIOPHARMACEUTICALS:  158.2 uCi I -123 orally

[Series 1000: (id) thyroid scan · 2.40mm/px · 3 of 3 slices shown]
[im 1/3]
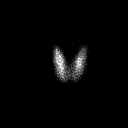
[im 2/3]
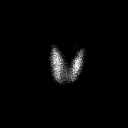
[im 3/3]
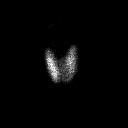

[3 of 3 positions shown; findings below may reference images not displayed]

FINDINGS: Images of the thyroid gland in 3 projections are normal and
unchanged.

No focal areas of increased or decreased tracer localization
identified..

4 hour V-SFO uptake = 30% (normal 5-20%)

24 hour V-SFO uptake = 48% (normal 10-30%)
IMPRESSION: Elevated 4 hour and 24 hour radio iodine uptakes as above.

Normal thyroid scan.

Findings consistent with Graves disease.

## 2016-10-31 MED ORDER — SODIUM IODIDE I-123 7.4 MBQ CAPS
158.2000 | ORAL_CAPSULE | Freq: Once | ORAL | Status: AC
  Administered 2016-10-31: 158.2 via ORAL

## 2016-11-01 ENCOUNTER — Encounter
Admission: RE | Admit: 2016-11-01 | Discharge: 2016-11-01 | Disposition: A | Discharge status: Home / Self Care | Payer: BC Managed Care – PPO | Source: Ambulatory Visit | Attending: Internal Medicine | Admitting: Internal Medicine

## 2016-11-03 ENCOUNTER — Encounter
Admission: RE | Admit: 2016-11-03 | Discharge: 2016-11-03 | Disposition: A | Discharge status: Home / Self Care | Payer: BC Managed Care – PPO | Source: Ambulatory Visit | Attending: Internal Medicine | Admitting: Internal Medicine

## 2016-11-03 DIAGNOSIS — E0500 Thyrotoxicosis with diffuse goiter without thyrotoxic crisis or storm: Secondary | ICD-10-CM

## 2016-11-03 MED ORDER — SODIUM IODIDE I 131 CAPSULE
15.5900 | Freq: Once | INTRAVENOUS | Status: AC | PRN
  Administered 2016-11-03: 15.59 via ORAL

## 2017-10-25 DIAGNOSIS — E890 Postprocedural hypothyroidism: Secondary | ICD-10-CM | POA: Insufficient documentation

## 2017-10-25 HISTORY — DX: Postprocedural hypothyroidism: E89.0

## 2018-01-17 ENCOUNTER — Encounter: Payer: Self-pay | Admitting: Obstetrics and Gynecology

## 2018-01-17 ENCOUNTER — Other Ambulatory Visit: Payer: Self-pay | Admitting: Obstetrics and Gynecology

## 2018-01-17 ENCOUNTER — Ambulatory Visit (INDEPENDENT_AMBULATORY_CARE_PROVIDER_SITE_OTHER): Payer: BC Managed Care – PPO | Admitting: Obstetrics and Gynecology

## 2018-01-17 VITALS — BP 130/80 | Ht 66.0 in | Wt 119.0 lb

## 2018-01-17 DIAGNOSIS — Z1211 Encounter for screening for malignant neoplasm of colon: Secondary | ICD-10-CM

## 2018-01-17 DIAGNOSIS — Z1231 Encounter for screening mammogram for malignant neoplasm of breast: Secondary | ICD-10-CM | POA: Diagnosis not present

## 2018-01-17 DIAGNOSIS — Z113 Encounter for screening for infections with a predominantly sexual mode of transmission: Secondary | ICD-10-CM | POA: Diagnosis not present

## 2018-01-17 DIAGNOSIS — Z01419 Encounter for gynecological examination (general) (routine) without abnormal findings: Secondary | ICD-10-CM | POA: Diagnosis not present

## 2018-01-17 DIAGNOSIS — Z1239 Encounter for other screening for malignant neoplasm of breast: Secondary | ICD-10-CM

## 2018-01-17 LAB — HEMOCCULT GUIAC POC 1CARD (OFFICE): Fecal Occult Blood, POC: NEGATIVE

## 2018-01-17 NOTE — Patient Instructions (Addendum)
I value your feedback and entrusting us with your care. If you get a [facility] patient survey, I would appreciate you taking the time to let us know about your experience today. Thank you!  [facility]: [phone]  [facility]: [phone]  

## 2018-01-17 NOTE — Progress Notes (Signed)
PCP: McLean-Scocuzza, Pasty Spillers, MD   Chief Complaint  Patient presents with  . Gynecologic Exam    HPI:      Ms. Megan Poole is a 52 y.o. [phone] who LMP was No LMP recorded., presents today for her annual examination.  Her menses are absent due to menopause. She does not have intermenstrual bleeding. She does have vasomotor sx that are tolerable for her. Also under increased stress recently.   Sex activity: not sexually active. She does not have vaginal dryness.  Last Pap: September 30, 2015  Results were: no abnormalities /neg HPV DNA.  Hx of STDs: none. Wants full STD testing. No known exposures. No vag sx.  Last mammogram: September 30, 2015  Results were: normal--routine follow-up in 12 months There is no FH of breast cancer. There is no FH of ovarian cancer. The patient does do self-breast exams.  Colonoscopy: never; wants to sched through PCP  Tobacco use: The patient currently smokes 1 packs of cigarettes per day for the past many years. Not able to quit currently but would like to eventually. Alcohol use: social drinker Exercise: moderately active  She does not get adequate calcium and Vitamin D in her diet.  Labs with endocrine. Seeing PCP in a couple wks.   Past Medical History:  Diagnosis Date  . Thyroid disease     Past Surgical History:  Procedure Laterality Date  . BREAST SURGERY Right 2000   biopsy - benign  . OVARIAN CYST REMOVAL    . SALPINGOOPHORECTOMY Right 2009   ovarian cyst    Family History  Problem Relation Age of Onset  . Heart disease Father 64       CAD   . Cancer Father        lung  . Cancer Sister        cervical cancer  . Hypertension Paternal Grandmother     Social History   Socioeconomic History  . Marital status: Married    Spouse name: Corporate investment banker  . Number of children: 1  . Years of education: 77  . Highest education level: Not on file  Occupational History  . 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 Financial resource strain: Not on file  . Food insecurity:    Worry: Not on file    Inability: Not on file  . Transportation needs:    Medical: Not on file    Non-medical: Not on file  Tobacco Use  . Smoking status: Current Every Day Smoker    Packs/day: 0.10    Years: 30.00    Pack years: 3.00    Types: Cigarettes  . Smokeless tobacco: Never Used  . Tobacco comment: Patches  Substance and Sexual Activity  . Alcohol use: No    Alcohol/week: 0.0 oz    Comment: occasional use  . Drug use: No  . Sexual activity: Not Currently  Lifestyle  . Physical activity:    Days per week: Not on file    Minutes per session: Not on file  . Stress: Not on file  Relationships  . Social connections:    Talks on phone: Not on file    Gets together: Not on file    Attends religious service: Not on file    Active member of club or organization: Not on file    Attends meetings of clubs or organizations: Not on file  Relationship status: Not on file  . Intimate partner violence:    Fear of current or ex partner: Not on file    Emotionally abused: Not on file    Physically abused: Not on file    Forced sexual activity: Not on file  Other Topics Concern  . Not on file  Social History Narrative   Hiromi was born and reared in [place]. She graduated high school from [employer]. She lives in [place] with her husband of 6 years. She has a 29 year old daughter who lives in [place]. 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 She enjoys camping, boating, riding horses. 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     Outpatient Medications Prior to Visit  Medication Sig Dispense Refill  . ibuprofen (ADVIL,MOTRIN) 200 MG tablet Take 400 mg by mouth every 6 (six) hours as needed.    Marland Kitchen levothyroxine (SYNTHROID, LEVOTHROID) 75 MCG tablet   1   No facility-administered medications prior  to visit.     ROS:  Review of Systems  Constitutional: Negative for fatigue, fever and unexpected weight change.  Respiratory: Negative for cough, shortness of breath and wheezing.   Cardiovascular: Negative for chest pain, palpitations and leg swelling.  Gastrointestinal: Negative for blood in stool, constipation, diarrhea, nausea and vomiting.  Endocrine: Negative for cold intolerance, heat intolerance and polyuria.  Genitourinary: Negative for dyspareunia, dysuria, flank pain, frequency, genital sores, hematuria, menstrual problem, pelvic pain, urgency, vaginal bleeding, vaginal discharge and vaginal pain.  Musculoskeletal: Negative for back pain, joint swelling and myalgias.  Skin: Negative for rash.  Neurological: Negative for dizziness, syncope, light-headedness, numbness and headaches.  Hematological: Negative for adenopathy.  Psychiatric/Behavioral: Negative for agitation, confusion, sleep disturbance and suicidal ideas. The patient is not nervous/anxious.    BREAST: No symptoms    Objective: BP 130/80   Ht  (1.676 m)   Wt 119 lb (54 kg)   BMI 19.21 kg/m    Physical Exam  Constitutional: She is oriented to person, place, and time. She appears well-developed and well-nourished.  Genitourinary: Vagina normal and uterus normal. There is no rash or tenderness on the right labia. There is no rash or tenderness on the left labia. No erythema or tenderness in the vagina. No vaginal discharge found. Right adnexum does not display mass and does not display tenderness. Left adnexum does not display mass and does not display tenderness. Cervix does not exhibit motion tenderness or polyp. Uterus is not enlarged or tender.  Neck: Normal range of motion. No thyromegaly present.  Cardiovascular: Normal rate, regular rhythm and normal heart sounds.  No murmur heard. Pulmonary/Chest: Effort normal and breath sounds normal. Right breast exhibits no mass, no nipple discharge, no skin  change and no tenderness. Left breast exhibits no mass, no nipple discharge, no skin change and no tenderness.  Abdominal: Soft. There is no tenderness. There is no guarding.  Musculoskeletal: Normal range of motion.  Neurological: She is alert and oriented to person, place, and time. No cranial nerve deficit.  Psychiatric: She has a normal mood and affect. Her behavior is normal.  Vitals reviewed.   Results: Results for orders placed or performed in visit on 01/17/18 (from the past 24 hour(s))  POCT Occult Blood Stool     Status: Normal   Collection Time: 01/17/18 11:37 AM  Result Value Ref Range   Fecal Occult Blood, POC Negative Negative   Card #1  Date     Card #2 Fecal Occult Blod, POC     Card #2 Date     Card #3 Fecal Occult Blood, POC     Card #3 Date      Assessment/Plan:  Encounter for annual routine gynecological examination  Screening for breast cancer - Pt to sched mammo - Plan: MM DIGITAL SCREENING BILATERAL  Screening for STD (sexually transmitted disease) - Will call wiht results. - Plan: Chlamydia/Gonococcus/Trichomonas, NAA, HIV antibody, RPR, HSV 2 antibody, IgG, Hepatitis C antibody  Screening for colon cancer - Neg FOBT. Pt will sched colonoscopy through PCP. - Plan: POCT Occult Blood Stool        GYN counsel breast self exam, mammography screening, menopause, adequate intake of calcium and vitamin D, diet and exercise, tobacco cessation    F/U  Return in about 1 year (around 01/18/2019).  Noura Purpura B. Astin Sayre, PA-C 01/17/2018 11:38 AM

## 2018-01-18 LAB — HSV 2 ANTIBODY, IGG: HSV 2 IGG, TYPE SPEC: 6.16 {index} — AB (ref 0.00–0.90)

## 2018-01-18 LAB — HIV ANTIBODY (ROUTINE TESTING W REFLEX): HIV Screen 4th Generation wRfx: NONREACTIVE

## 2018-01-18 LAB — RPR: RPR Ser Ql: NONREACTIVE

## 2018-01-18 LAB — HEPATITIS C ANTIBODY: Hep C Virus Ab: <0.1 s/co ratio (ref 0.0–0.9)

## 2018-01-20 LAB — CHLAMYDIA/GONOCOCCUS/TRICHOMONAS, NAA
Chlamydia by NAA: NEGATIVE
Gonococcus by NAA: NEGATIVE
Trich vag by NAA: NEGATIVE

## 2018-01-21 ENCOUNTER — Telehealth: Payer: Self-pay

## 2018-01-21 NOTE — Telephone Encounter (Signed)
Spoke w/pt. Advised GCT Neg. Other Blood tests have not resulted. Message to ABC to f/u when results ready.

## 2018-01-21 NOTE — Telephone Encounter (Signed)
Pt inquiring about results from 01/17/18 testing [phone]

## 2018-01-22 NOTE — Telephone Encounter (Signed)
Patient is returning missed call from [facility]. please advise

## 2018-01-22 NOTE — Telephone Encounter (Signed)
Done

## 2018-01-22 NOTE — Telephone Encounter (Signed)
LMTRC

## 2018-01-25 ENCOUNTER — Encounter: Payer: Self-pay | Admitting: Internal Medicine

## 2018-01-25 ENCOUNTER — Ambulatory Visit: Payer: BC Managed Care – PPO | Admitting: Internal Medicine

## 2018-01-25 VITALS — BP 118/82 | HR 93 | Temp 99.1°F | Resp 15 | Ht 64.0 in | Wt 119.2 lb

## 2018-01-25 DIAGNOSIS — Z1211 Encounter for screening for malignant neoplasm of colon: Secondary | ICD-10-CM

## 2018-01-25 DIAGNOSIS — Z720 Tobacco use: Secondary | ICD-10-CM | POA: Diagnosis not present

## 2018-01-25 DIAGNOSIS — Z0184 Encounter for antibody response examination: Secondary | ICD-10-CM

## 2018-01-25 DIAGNOSIS — E039 Hypothyroidism, unspecified: Secondary | ICD-10-CM | POA: Diagnosis not present

## 2018-01-25 DIAGNOSIS — E785 Hyperlipidemia, unspecified: Secondary | ICD-10-CM | POA: Diagnosis not present

## 2018-01-25 DIAGNOSIS — Z0000 Encounter for general adult medical examination without abnormal findings: Secondary | ICD-10-CM

## 2018-01-25 DIAGNOSIS — Z1389 Encounter for screening for other disorder: Secondary | ICD-10-CM

## 2018-01-25 DIAGNOSIS — Z1159 Encounter for screening for other viral diseases: Secondary | ICD-10-CM

## 2018-01-25 DIAGNOSIS — Z1231 Encounter for screening mammogram for malignant neoplasm of breast: Secondary | ICD-10-CM

## 2018-01-25 NOTE — Patient Instructions (Addendum)
We will recheck your lungs in 2 weeks  sch fasting labs before no food only water and medications x 12 hours Shingrix vaccine think about it  Please call and schedule mammogram We will refer you to [facility] for colonoscopy   Tdap/DTaP Vaccine (Diphtheria, Tetanus, and Pertussis): What You Need to Know 1. Why get vaccinated? Diphtheria, tetanus, and pertussis are serious diseases caused by bacteria. Diphtheria and pertussis are spread from person to person. Tetanus enters the body through cuts or wounds. DIPHTHERIA causes a thick covering in the back of the throat.  It can lead to breathing problems, paralysis, heart failure, and even death.  TETANUS (Lockjaw) causes painful tightening of the muscles, usually all over the body.  It can lead to "locking" of the jaw so the victim cannot open his mouth or swallow. Tetanus leads to death in up to 2 out of 10 cases.  PERTUSSIS (Whooping Cough) causes coughing spells so bad that it is hard for infants to eat, drink, or breathe. These spells can last for weeks.  It can lead to pneumonia, seizures (jerking and staring spells), brain damage, and death.  Diphtheria, tetanus, and pertussis vaccine (DTaP) can help prevent these diseases. Most children who are vaccinated with DTaP will be protected throughout childhood. Many more children would get these diseases if we stopped vaccinating. DTaP is a safer version of an older vaccine called DTP. DTP is no longer used in the [place]. 2. Who should get DTaP vaccine and when? Children should get 5 doses of DTaP vaccine, one dose at each of the following ages:  2 months  4 months  6 months  15-18 months  4-6 years  DTaP may be given at the same time as other vaccines. 3. Some children should not get DTaP vaccine or should wait  Children with minor illnesses, such as a cold, may be vaccinated. But children who are moderately or severely ill should usually wait until they recover before  getting DTaP vaccine.  Any child who had a life-threatening allergic reaction after a dose of DTaP should not get another dose.  Any child who suffered a brain or nervous system disease within 7 days after a dose of DTaP should not get another dose.  Talk with your doctor if your child: ? had a seizure or collapsed after a dose of DTaP, ? cried non-stop for 3 hours or more after a dose of DTaP, ? had a fever over 105F after a dose of DTaP. Ask your doctor for more information. Some of these children should not get another dose of pertussis vaccine, but may get a vaccine without pertussis, called DT. 4. Older children and adults DTaP is not licensed for adolescents, adults, or children 49 years of age and older. But older people still need protection. A vaccine called Tdap is similar to DTaP. A single dose of Tdap is recommended for people 11 through 53 years of age. Another vaccine, called Td, protects against tetanus and diphtheria, but not pertussis. It is recommended every 10 years. There are separate Vaccine Information Statements for these vaccines. 5. What are the risks from DTaP vaccine? Getting diphtheria, tetanus, or pertussis disease is much riskier than getting DTaP vaccine. However, a vaccine, like any medicine, is capable of causing serious problems, such as severe allergic reactions. The risk of DTaP vaccine causing serious harm, or death, is extremely small. Mild problems (common)  Fever (up to about 1 child in 4)  Redness or swelling where  the shot was given (up to about 1 child in 4)  Soreness or tenderness where the shot was given (up to about 1 child in 4) These problems occur more often after the 4th and 5th doses of the DTaP series than after earlier doses. Sometimes the 4th or 5th dose of DTaP vaccine is followed by swelling of the entire arm or leg in which the shot was given, lasting 1-7 days (up to about 1 child in 30). Other mild problems include:  Fussiness (up  to about 1 child in 3)  Tiredness or poor appetite (up to about 1 child in 10)  Vomiting (up to about 1 child in 50) These problems generally occur 1-3 days after the shot. Moderate problems (uncommon)  Seizure (jerking or staring) (about 1 child out of 14,000)  Non-stop crying, for 3 hours or more (up to about 1 child out of 1,000)  High fever, over 105F (about 1 child out of 16,000) Severe problems (very rare)  Serious allergic reaction (less than 1 out of a million doses)  Several other severe problems have been reported after DTaP vaccine. These include: ? Long-term seizures, coma, or lowered consciousness ? Permanent brain damage. These are so rare it is hard to tell if they are caused by the vaccine. Controlling fever is especially important for children who have had seizures, for any reason. It is also important if another family member has had seizures. You can reduce fever and pain by giving your child an aspirin-free pain reliever when the shot is given, and for the next 24 hours, following the package instructions. 6. What if there is a serious reaction? What should I look for? Look for anything that concerns you, such as signs of a severe allergic reaction, very high fever, or behavior changes. Signs of a severe allergic reaction can include hives, swelling of the face and throat, difficulty breathing, a fast heartbeat, dizziness, and weakness. These would start a few minutes to a few hours after the vaccination. What should I do?  If you think it is a severe allergic reaction or other emergency that can't wait, call 9-1-1 or get the person to the nearest hospital. Otherwise, call your doctor.  Afterward, the reaction should be reported to the Vaccine Adverse Event Reporting System (VAERS). Your doctor might file this report, or you can do it yourself through the VAERS web site at www.vaers.LAgents.no, or by calling 1-(704)361-4687. ? VAERS is only for reporting reactions.  They do not give medical advice. 7. The National Vaccine Injury Compensation Program The [employer] Vaccine Injury Compensation Program (VICP) is a federal program that was created to compensate people who may have been injured by certain vaccines. Persons who believe they may have been injured by a vaccine can learn about the program and about filing a claim by calling 1-321-807-2050 or visiting the VICP website at SpiritualWord.at. 8. How can I learn more?  Ask your doctor.  Call your local or state health department.  Contact the Centers for Disease Control and Prevention (CDC): ? Call 785-792-6672 (1-800-CDC-INFO) or ? Visit CDC's website at PicCapture.uy CDC DTaP Vaccine (Diphtheria, Tetanus, and Pertussis) VIS (01/25/06) This information is not intended to replace advice given to you by your health care provider. Make sure you discuss any questions you have with your health care provider. Document Released: 06/25/2006 Document Revised: 05/18/2016 Document Reviewed: 05/18/2016 Elsevier Interactive Patient Education  2017 [employer].   Recombinant Zoster (Shingles) Vaccine, RZV: What You Need to Know  1. Why get vaccinated? Shingles (also called herpes zoster, or just zoster) is a painful skin rash, often with blisters. Shingles is caused by the varicella zoster virus, the same virus that causes chickenpox. After you have chickenpox, the virus stays in your body and can cause shingles later in life. You can't catch shingles from another person. However, a person who has never had chickenpox (or chickenpox vaccine) could get chickenpox from someone with shingles. A shingles rash usually appears on one side of the face or body and heals within 2 to 4 weeks. Its main symptom is pain, which can be severe. Other symptoms can include fever, headache, chills and upset stomach. Very rarely, a shingles infection can lead to pneumonia, hearing problems, blindness, brain  inflammation (encephalitis), or death. For about 1 person in 5, severe pain can continue even long after the rash has cleared up. This long-lasting pain is called post-herpetic neuralgia (PHN). Shingles is far more common in people 51 years of age and older than in younger people, and the risk increases with age. It is also more common in people whose immune system is weakened because of a disease such as cancer, or by drugs such as steroids or chemotherapy. At least 1 million people a year in the [place] get shingles. 2. Shingles vaccine (recombinant) Recombinant shingles vaccine was approved by FDA in 2017 for the prevention of shingles. In clinical trials, it was more than 90% effective in preventing shingles. It can also reduce the likelihood of PHN. Two doses, 2 to 6 months apart, are recommended for adults 50 and older. This vaccine is also recommended for people who have already gotten the live shingles vaccine (Zostavax). There is no live virus in this vaccine. 3. Some people should not get this vaccine Tell your vaccine provider if you:  Have any severe, life-threatening allergies. A person who has ever had a life-threatening allergic reaction after a dose of recombinant shingles vaccine, or has a severe allergy to any component of this vaccine, may be advised not to be vaccinated. Ask your health care provider if you want information about vaccine components.  Are pregnant or breastfeeding. There is not much information about use of recombinant shingles vaccine in pregnant or nursing women. Your healthcare provider might recommend delaying vaccination.  Are not feeling well. If you have a mild illness, such as a cold, you can probably get the vaccine today. If you are moderately or severely ill, you should probably wait until you recover. Your doctor can advise you.  4. Risks of a vaccine reaction With any medicine, including vaccines, there is a chance of reactions. After  recombinant shingles vaccination, a person might experience:  Pain, redness, soreness, or swelling at the site of the injection  Headache, muscle aches, fever, shivering, fatigue  In clinical trials, most people got a sore arm with mild or moderate pain after vaccination, and some also had redness and swelling where they got the shot. Some people felt tired, had muscle pain, a headache, shivering, fever, stomach pain, or nausea. About 1 out of 6 people who got recombinant zoster vaccine experienced side effects that prevented them from doing regular activities. Symptoms went away on their own in about 2 to 3 days. Side effects were more common in younger people. You should still get the second dose of recombinant zoster vaccine even if you had one of these reactions after the first dose. Other things that could happen after this vaccine:  People sometimes faint  after medical procedures, including vaccination. Sitting or lying down for about 15 minutes can help prevent fainting and injuries caused by a fall. Tell your provider if you feel dizzy or have vision changes or ringing in the ears.  Some people get shoulder pain that can be more severe and longer-lasting than routine soreness that can follow injections. This happens very rarely.  Any medication can cause a severe allergic reaction. Such reactions to a vaccine are estimated at about 1 in a million doses, and would happen within a few minutes to a few hours after the vaccination. As with any medicine, there is a very remote chance of a vaccine causing a serious injury or death. The safety of vaccines is always being monitored. For more information, visit: http://floyd.org/ 5. What if there is a serious problem? What should I look for?  Look for anything that concerns you, such as signs of a severe allergic reaction, very high fever, or unusual behavior. Signs of a severe allergic reaction can include hives, swelling of the face  and throat, difficulty breathing, a fast heartbeat, dizziness, and weakness. These would usually start a few minutes to a few hours after the vaccination. What should I do?  If you think it is a severe allergic reaction or other emergency that can't wait, call 9-1-1 and get to the nearest hospital. Otherwise, call your health care provider. Afterward, the reaction should be reported to the Vaccine Adverse Event Reporting System (VAERS). Your doctor should file this report, or you can do it yourself through the VAERS web site atwww.vaers.https://coleman.net/ by calling 409-620-2124. VAERS does not give medical advice. 6. How can I learn more?  Ask your healthcare provider. He or she can give you the vaccine package insert or suggest other sources of information.  Call your local or state health department.  Contact the Centers for Disease Control and Prevention (CDC): ? Call 351-582-6802 (1-800-CDC-INFO) or ? Visit the CDC's website at PicCapture.uy CDC Vaccine Information Statement (VIS) Recombinant Zoster Vaccine (10/23/2016) This information is not intended to replace advice given to you by your health care provider. Make sure you discuss any questions you have with your health care provider. Document Released: 11/07/2016 Document Revised: 11/07/2016 Document Reviewed: 11/07/2016 Elsevier Interactive Patient Education  2018 [employer].   Bronchospasm, Adult Bronchospasm is a tightening of the airways going into the lungs. During an episode, it may be harder to breathe. You may cough, and you may make a whistling sound when you breathe (wheeze). This condition often affects people with asthma. What are the causes? This condition is caused by swelling and irritation in the airways. It can be triggered by:  An infection (common).  Seasonal allergies.  An allergic reaction.  Exercise.  Irritants. These include pollution, cigarette smoke, strong odors, aerosol sprays, and paint  fumes.  Weather changes. Winds increase molds and pollens in the air. Cold air may cause swelling.  Stress and emotional upset.  What are the signs or symptoms? Symptoms of this condition include:  Wheezing. If the episode was triggered by an allergy, wheezing may start right away or hours later.  Nighttime coughing.  Frequent or severe coughing with a simple cold.  Chest tightness.  Shortness of breath.  Decreased ability to exercise.  How is this diagnosed? This condition is usually diagnosed with a review of your medical history and a physical exam. Tests, such as lung function tests, are sometimes done to look for other conditions. The need for a chest X-ray  depends on where the wheezing occurs and whether it is the first time you have wheezed. How is this treated? This condition may be treated with:  Inhaled medicines. These open up the airways and help you breathe. They can be taken with an inhaler or a nebulizer device.  Corticosteroid medicines. These may be given for severe bronchospasm, usually when it is associated with asthma.  Avoiding triggers, such as irritants, infection, or allergies.  Follow these instructions at home: Medicines  Take over-the-counter and prescription medicines only as told by your health care provider.  If you need to use an inhaler or nebulizer to take your medicine, ask your health care provider to explain how to use it correctly. If you were given a spacer, always use it with your inhaler. Lifestyle  Reduce the number of triggers in your home. To do this: ? Change your heating and air conditioning filter at least once a month. ? Limit your use of fireplaces and wood stoves. ? Do not smoke. Do not allow smoking in your home. ? Avoid using perfumes and fragrances. ? Get rid of pests, such as roaches and mice, and their droppings. ? Remove any mold from your home. ? Keep your house clean and dust free. Use unscented cleaning  products. ? Replace carpet with wood, tile, or vinyl flooring. Carpet can trap dander and dust. ? Use allergy-proof pillows, mattress covers, and box spring covers. ? Wash bed sheets and blankets every week in hot water. Dry them in a dryer. ? Use blankets that are made of polyester or cotton. ? Wash your hands often. ? Do not allow pets in your bedroom.  Avoid breathing in cold air when you exercise. General instructions  Have a plan for seeking medical care. Know when to call your health care provider and local emergency services, and where to get emergency care.  Stay up to date on your immunizations.  When you have an episode of bronchospasm, stay calm. Try to relax and breathe more slowly.  If you have asthma, make sure you have an asthma action plan.  Keep all follow-up visits as told by your health care provider. This is important. Contact a health care provider if:  You have muscle aches.  You have chest pain.  The mucus that you cough up (sputum) changes from clear or white to yellow, green, gray, or bloody.  You have a fever.  Your sputum gets thicker. Get help right away if:  Your wheezing and coughing get worse, even after you take your prescribed medicines.  It gets even harder to breathe.  You develop severe chest pain. Summary  Bronchospasm is a tightening of the airways going into the lungs.  During an episode of bronchospasm, you may have a harder time breathing. You may cough and make a whistling sound when you breathe (wheeze).  Avoid exposure to triggers such as smoke, dust, mold, animal dander, and fragrances.  When you have an episode of bronchospasm, stay calm. Try to relax and breathe more slowly. This information is not intended to replace advice given to you by your health care provider. Make sure you discuss any questions you have with your health care provider. Document Released: 08/31/2003 Document Revised: 08/24/2016 Document Reviewed:  08/24/2016 Elsevier Interactive Patient Education  2017 [employer].

## 2018-01-25 NOTE — Progress Notes (Signed)
Chief Complaint  Patient presents with  . Establish Care   Re-establish no issues  1. Graves tx'ed now with hypothyroidism on levo 75 mcg follows with Dr. [facility]  2. She has a lot of stress recently but did not disclose  3. Tobacco abuse 1 ppd x year at least since high school dad died of lung cancer. She is not interested in quitting 2/2 stress  4. HLD noted 2016 labs   Review of Systems  Constitutional: Negative for weight loss.  HENT: Negative for hearing loss.   Eyes: Negative for blurred vision.  Respiratory: Negative for shortness of breath.   Cardiovascular: Negative for chest pain.  Gastrointestinal: Negative for abdominal pain.  Musculoskeletal: Negative for falls.  Skin: Negative for rash.  Neurological: Negative for headaches.  Psychiatric/Behavioral:       +stress    Past Medical History:  Diagnosis Date  . Hyperlipidemia   . Thyroid disease    graves tx'ed now with hypothyroidism   . Tobacco abuse    Past Surgical History:  Procedure Laterality Date  . BREAST SURGERY Right 2000   biopsy - benign  . OVARIAN CYST REMOVAL    . SALPINGOOPHORECTOMY Right 2009   ovarian cyst   Family History  Problem Relation Age of Onset  . Heart disease Father 76       CAD   . Cancer Father        lung, smoker   . Cancer Sister        cervical cancer  . Hypertension Paternal Grandmother    Social History   Socioeconomic History  . Marital status: Married    Spouse name: Corporate investment banker  . Number of children: 1  . Years of education: 85  . Highest education level: Not on file  Occupational History  . 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 Financial resource strain: Not on file  . Food insecurity:    Worry: Not on file    Inability: Not on file  . Transportation needs:    Medical: Not on file    Non-medical: Not on file  Tobacco Use  . Smoking status: Current Every Day Smoker     Packs/day: 1.00    Years: 30.00    Pack years: 30.00    Types: Cigarettes  . Smokeless tobacco: Never Used  . Tobacco comment: Patches  Substance and Sexual Activity  . Alcohol use: No    Alcohol/week: 0.0 oz    Comment: occasional use  . Drug use: No  . Sexual activity: Not Currently  Lifestyle  . Physical activity:    Days per week: Not on file    Minutes per session: Not on file  . Stress: Not on file  Relationships  . Social connections:    Talks on phone: Not on file    Gets together: Not on file    Attends religious service: Not on file    Active member of club or organization: Not on file    Attends meetings of clubs or organizations: Not on file    Relationship status: Not on file  . Intimate partner violence:    Fear of current or ex partner: Not on file    Emotionally abused: Not on file    Physically abused: Not on file    Forced sexual activity: Not on file  Other Topics Concern  . Not on file  Social History Narrative   Infinity was born and reared in [place]. She graduated high school from [employer]. She lives in [place] with her husband of 6 years. She has a 49 year old daughter who has 3 kids. 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 She enjoys camping, boating, riding horses. 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    Current Meds  Medication Sig  . ibuprofen (ADVIL,MOTRIN) 200 MG tablet Take 400 mg by mouth every 6 (six) hours as needed.  Marland Kitchen levothyroxine (SYNTHROID, LEVOTHROID) 75 MCG tablet    No Known Allergies Recent Results (from the past 2160 hour(s))  POCT Occult Blood Stool     Status: Normal   Collection Time: 01/17/18 11:37 AM  Result Value Ref Range   Fecal Occult Blood, POC Negative Negative   Card #1 Date     Card #2 Fecal Occult Blod, POC     Card #2 Date     Card #3 Fecal Occult Blood, POC     Card #3 Date    RPR     Status: None   Collection Time: 01/17/18 11:49 AM  Result  Value Ref Range   RPR Ser Ql Non Reactive Non Reactive  HIV antibody     Status: None   Collection Time: 01/17/18 11:49 AM  Result Value Ref Range   HIV Screen 4th Generation wRfx Non Reactive Non Reactive  Hepatitis C antibody     Status: None   Collection Time: 01/17/18 11:49 AM  Result Value Ref Range   Hep C Virus Ab <0.1 0.0 - 0.9 s/co ratio    Comment:                                   Negative:     < 0.8                              Indeterminate: 0.8 - 0.9                                   Positive:     > 0.9  The CDC recommends that a positive HCV antibody result  be followed up with a HCV Nucleic Acid Amplification  test (960454).   HSV 2 antibody, IgG     Status: Abnormal   Collection Time: 01/17/18 11:49 AM  Result Value Ref Range   HSV 2 IgG, Type Spec 6.16 (H) 0.00 - 0.90 index    Comment:                                  Negative        <0.91                                  Equivocal 0.91 - 1.09                                  Positive        >1.09  Note: Negative indicates no antibodies detected to  HSV-2. Equivocal  may suggest early infection.  If  clinically appropriate, retest at later date. 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   Chlamydia/Gonococcus/Trichomonas, NAA     Status: None   Collection Time: 01/17/18  3:02 PM  Result Value Ref Range   Chlamydia by NAA Negative Negative   Gonococcus by NAA Negative Negative   Trich vag by NAA Negative Negative   Objective  Body mass index is 19.25 kg/m. Wt Readings from Last 3 Encounters:  01/25/18 119 lb 4 oz (54.1 kg)  01/17/18 119 lb (54 kg)  08/13/15 126 lb (57.2 kg)   Temp Readings from Last 3 Encounters:  01/25/18 99.1 F (37.3 C) (Oral)  08/13/15 98 F (36.7 C) (Oral)  06/15/15 98.3 F (36.8 C)   BP Readings from Last 3 Encounters:  01/25/18 118/82  01/17/18 130/80  08/13/15 104/60   Pulse Readings from Last 3 Encounters:  01/25/18 93  08/13/15 72  06/15/15 89     Physical Exam  Constitutional: She is oriented to person, place, and time. Vital signs are normal. She appears well-developed and well-nourished. She is cooperative.  HENT:  Head: Normocephalic and atraumatic.  Mouth/Throat: Oropharynx is clear and moist and mucous membranes are normal.  Eyes: Pupils are equal, round, and reactive to light. Conjunctivae are normal.  Cardiovascular: Normal rate, regular rhythm and normal heart sounds.  Pulmonary/Chest: Effort normal. She has wheezes.  B/l lungs wheezing   Neurological: She is alert and oriented to person, place, and time. Gait normal.  Skin: Skin is warm, dry and intact.  Psychiatric: She has a normal mood and affect. Her speech is normal and behavior is normal. Judgment and thought content normal. Cognition and memory are normal.  Nursing note and vitals reviewed.   Assessment   1. Graves tx'ed now with hypothyroidism  2. Tobacco abuse 1 ppd since high school  3. HM  4. HLD Plan  1. Levothyroxine 75 mcg qd  F/u [facility] Dr. Gershon Crane  2.  With wheezing on exam today if has at f/u will do CXR, consider Abx and duoneb albuterol inhaler to w/u COPD rec smoking cessation  3.  Ask about flu shot at f/u  Tdap disc today pt declines will consider for f/u  shingrix given info today   Referred mammogram pt to sch  Pap 2017 [facility] will get copy  Colonoscopy referred today [facility]  Physical at f/u   4. Recheck cholesterol  Provider: Dr. French Ana McLean-Scocuzza-Internal Medicine

## 2018-02-08 ENCOUNTER — Ambulatory Visit (INDEPENDENT_AMBULATORY_CARE_PROVIDER_SITE_OTHER): Payer: BC Managed Care – PPO | Admitting: Internal Medicine

## 2018-02-08 ENCOUNTER — Encounter: Payer: Self-pay | Admitting: Internal Medicine

## 2018-02-08 VITALS — BP 130/78 | HR 91 | Temp 98.3°F | Ht 64.0 in | Wt 118.8 lb

## 2018-02-08 DIAGNOSIS — F419 Anxiety disorder, unspecified: Secondary | ICD-10-CM | POA: Diagnosis not present

## 2018-02-08 DIAGNOSIS — Z720 Tobacco use: Secondary | ICD-10-CM

## 2018-02-08 DIAGNOSIS — Z0000 Encounter for general adult medical examination without abnormal findings: Secondary | ICD-10-CM

## 2018-02-08 DIAGNOSIS — E785 Hyperlipidemia, unspecified: Secondary | ICD-10-CM | POA: Diagnosis not present

## 2018-02-08 HISTORY — DX: Encounter for general adult medical examination without abnormal findings: Z00.00

## 2018-02-08 MED ORDER — ALPRAZOLAM 0.25 MG PO TABS: 0.2500 mg | ORAL_TABLET | Freq: Every day | ORAL | 2 refills | Status: DC | PRN

## 2018-02-08 NOTE — Patient Instructions (Addendum)
Please call me if you continue to wheeze  Labs sch 02/2018   Bronchospasm, Adult Bronchospasm is a tightening of the airways going into the lungs. During an episode, it may be harder to breathe. You may cough, and you may make a whistling sound when you breathe (wheeze). This condition often affects people with asthma. What are the causes? This condition is caused by swelling and irritation in the airways. It can be triggered by:  An infection (common).  Seasonal allergies.  An allergic reaction.  Exercise.  Irritants. These include pollution, cigarette smoke, strong odors, aerosol sprays, and paint fumes.  Weather changes. Winds increase molds and pollens in the air. Cold air may cause swelling.  Stress and emotional upset.  What are the signs or symptoms? Symptoms of this condition include:  Wheezing. If the episode was triggered by an allergy, wheezing may start right away or hours later.  Nighttime coughing.  Frequent or severe coughing with a simple cold.  Chest tightness.  Shortness of breath.  Decreased ability to exercise.  How is this diagnosed? This condition is usually diagnosed with a review of your medical history and a physical exam. Tests, such as lung function tests, are sometimes done to look for other conditions. The need for a chest X-ray depends on where the wheezing occurs and whether it is the first time you have wheezed. How is this treated? This condition may be treated with:  Inhaled medicines. These open up the airways and help you breathe. They can be taken with an inhaler or a nebulizer device.  Corticosteroid medicines. These may be given for severe bronchospasm, usually when it is associated with asthma.  Avoiding triggers, such as irritants, infection, or allergies.  Follow these instructions at home: Medicines  Take over-the-counter and prescription medicines only as told by your health care provider.  If you need to use an inhaler  or nebulizer to take your medicine, ask your health care provider to explain how to use it correctly. If you were given a spacer, always use it with your inhaler. Lifestyle  Reduce the number of triggers in your home. To do this: ? Change your heating and air conditioning filter at least once a month. ? Limit your use of fireplaces and wood stoves. ? Do not smoke. Do not allow smoking in your home. ? Avoid using perfumes and fragrances. ? Get rid of pests, such as roaches and mice, and their droppings. ? Remove any mold from your home. ? Keep your house clean and dust free. Use unscented cleaning products. ? Replace carpet with wood, tile, or vinyl flooring. Carpet can trap dander and dust. ? Use allergy-proof pillows, mattress covers, and box spring covers. ? Wash bed sheets and blankets every week in hot water. Dry them in a dryer. ? Use blankets that are made of polyester or cotton. ? Wash your hands often. ? Do not allow pets in your bedroom.  Avoid breathing in cold air when you exercise. General instructions  Have a plan for seeking medical care. Know when to call your health care provider and local emergency services, and where to get emergency care.  Stay up to date on your immunizations.  When you have an episode of bronchospasm, stay calm. Try to relax and breathe more slowly.  If you have asthma, make sure you have an asthma action plan.  Keep all follow-up visits as told by your health care provider. This is important. Contact a health care provider if:  You have muscle aches.  You have chest pain.  The mucus that you cough up (sputum) changes from clear or white to yellow, green, gray, or bloody.  You have a fever.  Your sputum gets thicker. Get help right away if:  Your wheezing and coughing get worse, even after you take your prescribed medicines.  It gets even harder to breathe.  You develop severe chest pain. Summary  Bronchospasm is a tightening of  the airways going into the lungs.  During an episode of bronchospasm, you may have a harder time breathing. You may cough and make a whistling sound when you breathe (wheeze).  Avoid exposure to triggers such as smoke, dust, mold, animal dander, and fragrances.  When you have an episode of bronchospasm, stay calm. Try to relax and breathe more slowly. This information is not intended to replace advice given to you by your health care provider. Make sure you discuss any questions you have with your health care provider. Document Released: 08/31/2003 Document Revised: 08/24/2016 Document Reviewed: 08/24/2016 Elsevier Interactive Patient Education  2017 [employer].

## 2018-02-08 NOTE — Progress Notes (Signed)
Pre visit review using our clinic review tool, if applicable. No additional management support is needed unless otherwise documented below in the visit note. 

## 2018-02-11 ENCOUNTER — Encounter: Payer: Self-pay | Admitting: Internal Medicine

## 2018-02-11 DIAGNOSIS — F419 Anxiety disorder, unspecified: Secondary | ICD-10-CM

## 2018-02-11 HISTORY — DX: Anxiety disorder, unspecified: F41.9

## 2018-02-11 NOTE — Progress Notes (Signed)
Chief Complaint  Patient presents with  . Annual Exam  . Anxiety   Annual  1. Anxiety phq 9 score 8 today c/o more anxiety than depression husband had extramarital affair 11/2017 and currently separated she has date with him tonight  2. Smoking not interested in quitting.     Review of Systems  Constitutional: Negative for weight loss.  HENT: Negative for hearing loss.   Eyes: Negative for blurred vision.  Respiratory: Negative for wheezing.   Cardiovascular: Negative for chest pain.  Gastrointestinal: Negative for abdominal pain.  Musculoskeletal: Negative for falls.  Skin: Negative for rash.  Neurological: Negative for headaches.  Psychiatric/Behavioral: Negative for depression. The patient is nervous/anxious.    Past Medical History:  Diagnosis Date  . Anxiety   . Hyperlipidemia   . Thyroid disease    graves tx'ed now with hypothyroidism   . Tobacco abuse    Past Surgical History:  Procedure Laterality Date  . BREAST SURGERY Right 2000   biopsy - benign  . OVARIAN CYST REMOVAL    . SALPINGOOPHORECTOMY Right 2009   ovarian cyst   Family History  Problem Relation Age of Onset  . Heart disease Father 3058       CAD   . Cancer Father        lung, smoker   . Cancer Sister        cervical cancer  . Hypertension Paternal Grandmother    Social History   Socioeconomic History  . Marital status: Married    Spouse name: Corporate investment bankerMitch Doorn  . Number of children: 1  . Years of education: 6312  . Highest education level: Not on file  Occupational History  . 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 Financial resource strain: Not on file  . Food insecurity:    Worry: Not on file    Inability: Not on file  . Transportation needs:    Medical: Not on file    Non-medical: Not on file  Tobacco Use  . Smoking status: Current Every Day Smoker    Packs/day: 1.00    Years: 30.00    Pack years: 30.00   Types: Cigarettes  . Smokeless tobacco: Never Used  . Tobacco comment: Patches  Substance and Sexual Activity  . Alcohol use: No    Alcohol/week: 0.0 oz    Comment: occasional use  . Drug use: No  . Sexual activity: Not Currently  Lifestyle  . Physical activity:    Days per week: Not on file    Minutes per session: Not on file  . Stress: Not on file  Relationships  . Social connections:    Talks on phone: Not on file    Gets together: Not on file    Attends religious service: Not on file    Active member of club or organization: Not on file    Attends meetings of clubs or organizations: Not on file    Relationship status: Not on file  . Intimate partner violence:    Fear of current or ex partner: Not on file    Emotionally abused: Not on file    Physically abused: Not on file    Forced sexual activity: Not on file  Other Topics Concern  . Not on file  Social History Narrative   Megan [place] was born [place] She graduated high school from [employer]. [place]  with her husband of 6 years. She has a 37 year old daughter who has 3 kids. 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 She enjoys camping, boating, riding horses. 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    Current Meds  Medication Sig  . ibuprofen (ADVIL,MOTRIN) 200 MG tablet Take 400 mg by mouth every 6 (six) hours as needed.  Marland Kitchen levothyroxine (SYNTHROID, LEVOTHROID) 75 MCG tablet    No Known Allergies Recent Results (from the past 2160 hour(s))  POCT Occult Blood Stool     Status: Normal   Collection Time: 01/17/18 11:37 AM  Result Value Ref Range   Fecal Occult Blood, POC Negative Negative   Card #1 Date     Card #2 Fecal Occult Blod, POC     Card #2 Date     Card #3 Fecal Occult Blood, POC     Card #3 Date    RPR     Status: None   Collection Time: 01/17/18 11:49 AM  Result Value Ref Range   RPR Ser Ql Non Reactive Non Reactive  HIV  antibody     Status: None   Collection Time: 01/17/18 11:49 AM  Result Value Ref Range   HIV Screen 4th Generation wRfx Non Reactive Non Reactive  Hepatitis C antibody     Status: None   Collection Time: 01/17/18 11:49 AM  Result Value Ref Range   Hep C Virus Ab <0.1 0.0 - 0.9 s/co ratio    Comment:                                   Negative:     < 0.8                              Indeterminate: 0.8 - 0.9                                   Positive:     > 0.9  The CDC recommends that a positive HCV antibody result  be followed up with a HCV Nucleic Acid Amplification  test ([id number]).   HSV 2 antibody, IgG     Status: Abnormal   Collection Time: 01/17/18 11:49 AM  Result Value Ref Range   HSV 2 IgG, Type Spec 6.16 (H) 0.00 - 0.90 index    Comment:                                  Negative        <0.91                                  Equivocal 0.91 - 1.09                                  Positive        >1.09  Note: Negative indicates no antibodies detected to  HSV-2. Equivocal may suggest early infection.  If  clinically appropriate, retest at later date. 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   Chlamydia/Gonococcus/Trichomonas, NAA  Status: None   Collection Time: 01/17/18  3:02 PM  Result Value Ref Range   Chlamydia by NAA Negative Negative   Gonococcus by NAA Negative Negative   Trich vag by NAA Negative Negative   Objective  Body mass index is 20.39 kg/m. Wt Readings from Last 3 Encounters:  02/08/18 118 lb 12.8 oz (53.9 kg)  01/25/18 119 lb 4 oz (54.1 kg)  01/17/18 119 lb (54 kg)   Temp Readings from Last 3 Encounters:  02/08/18 98.3 F (36.8 C) (Oral)  01/25/18 99.1 F (37.3 C) (Oral)  08/13/15 98 F (36.7 C) (Oral)   BP Readings from Last 3 Encounters:  02/08/18 130/78  01/25/18 118/82  01/17/18 130/80   Pulse Readings from Last 3 Encounters:  02/08/18 91  01/25/18 93  08/13/15 72    Physical Exam  Constitutional: She is oriented to  person, place, and time. Vital signs are normal. She appears well-developed and well-nourished. She is cooperative.  HENT:  Head: Normocephalic and atraumatic.  Mouth/Throat: Oropharynx is clear and moist and mucous membranes are normal.  Eyes: Pupils are equal, round, and reactive to light. Conjunctivae are normal.  Cardiovascular: Normal rate, regular rhythm and normal heart sounds.  Pulmonary/Chest: Effort normal. She has wheezes. Right breast exhibits no inverted nipple, no mass, no nipple discharge, no skin change and no tenderness. Left breast exhibits no inverted nipple, no mass, no nipple discharge, no skin change and no tenderness. No breast swelling, tenderness, discharge or bleeding. Breasts are symmetrical.  Mild wheezing on exam improved since last visit though still there   Abdominal: Soft. Bowel sounds are normal.  Neurological: She is alert and oriented to person, place, and time. Gait normal.  Skin: Skin is warm, dry and intact.  Psychiatric: She has a normal mood and affect. Her speech is normal and behavior is normal. Judgment and thought content normal. Cognition and memory are normal.  Nursing note and vitals reviewed.   Assessment   1. Annual  2. Anxiety phq 9 score 8 today declines depression  3. Tobacco abuse  Plan  1.  Had flu shot  Tdap has not had disc'ed as last visit  Given info shingrix last visit  Referred mammogram pt to sch  Pap 2017 [facility] will get copy last I have 2013 neg  Colonoscopy referred [facility] pt needs to sch  rec smoking cessation  pt needs to come in for fasting labs h/o HLD  2.  Trial of xanax 0.25 qd prn  F/u in 4 months  3. rec cessation pt not interested in quitting   Provider: Dr. French Ana McLean-Scocuzza-Internal Medicine

## 2018-02-27 ENCOUNTER — Encounter: Payer: Self-pay | Admitting: Radiology

## 2018-02-27 ENCOUNTER — Ambulatory Visit
Admission: RE | Admit: 2018-02-27 | Discharge: 2018-02-27 | Disposition: A | Discharge status: Home / Self Care | Payer: BC Managed Care – PPO | Source: Ambulatory Visit | Attending: Internal Medicine | Admitting: Internal Medicine

## 2018-02-27 DIAGNOSIS — Z1231 Encounter for screening mammogram for malignant neoplasm of breast: Secondary | ICD-10-CM | POA: Insufficient documentation

## 2018-03-07 ENCOUNTER — Other Ambulatory Visit (INDEPENDENT_AMBULATORY_CARE_PROVIDER_SITE_OTHER): Payer: BC Managed Care – PPO

## 2018-03-07 DIAGNOSIS — E785 Hyperlipidemia, unspecified: Secondary | ICD-10-CM | POA: Diagnosis not present

## 2018-03-07 DIAGNOSIS — Z0184 Encounter for antibody response examination: Secondary | ICD-10-CM

## 2018-03-07 DIAGNOSIS — Z1159 Encounter for screening for other viral diseases: Secondary | ICD-10-CM

## 2018-03-07 DIAGNOSIS — Z1389 Encounter for screening for other disorder: Secondary | ICD-10-CM

## 2018-03-07 DIAGNOSIS — Z0000 Encounter for general adult medical examination without abnormal findings: Secondary | ICD-10-CM

## 2018-03-07 LAB — COMPREHENSIVE METABOLIC PANEL
ALK PHOS: 69 U/L (ref 39–117)
ALT: 10 U/L (ref 0–35)
AST: 15 U/L (ref 0–37)
Albumin: 4.5 g/dL (ref 3.5–5.2)
BUN: 18 mg/dL (ref 6–23)
CO2: 29 meq/L (ref 19–32)
Calcium: 9.9 mg/dL (ref 8.4–10.5)
Chloride: 103 mEq/L (ref 96–112)
Creatinine, Ser: 0.83 mg/dL (ref 0.40–1.20)
GFR: 76.5 mL/min (ref >60.00)
GLUCOSE: 93 mg/dL (ref 70–99)
POTASSIUM: 4.4 meq/L (ref 3.5–5.1)
SODIUM: 137 meq/L (ref 135–145)
Total Bilirubin: 0.3 mg/dL (ref 0.2–1.2)
Total Protein: 7.3 g/dL (ref 6.0–8.3)

## 2018-03-07 LAB — CBC WITH DIFFERENTIAL/PLATELET
BASOS ABS: 0.1 10*3/uL (ref 0.0–0.1)
Basophils Relative: 0.9 % (ref 0.0–3.0)
EOS ABS: 0.2 10*3/uL (ref 0.0–0.7)
Eosinophils Relative: 2.1 % (ref 0.0–5.0)
HCT: 44.4 % (ref 36.0–46.0)
Hemoglobin: 15.1 g/dL — ABNORMAL HIGH (ref 12.0–15.0)
LYMPHS ABS: 2.2 10*3/uL (ref 0.7–4.0)
Lymphocytes Relative: 27.5 % (ref 12.0–46.0)
MCHC: 34.1 g/dL (ref 30.0–36.0)
MCV: 95.4 fl (ref 78.0–100.0)
MONO ABS: 0.7 10*3/uL (ref 0.1–1.0)
Monocytes Relative: 8.9 % (ref 3.0–12.0)
NEUTROS ABS: 4.9 10*3/uL (ref 1.4–7.7)
NEUTROS PCT: 60.6 % (ref 43.0–77.0)
PLATELETS: 216 10*3/uL (ref 150.0–400.0)
RBC: 4.65 Mil/uL (ref 3.87–5.11)
RDW: 13.5 % (ref 11.5–15.5)
WBC: 8.1 10*3/uL (ref 4.0–10.5)

## 2018-03-07 LAB — LIPID PANEL
CHOL/HDL RATIO: 4
Cholesterol: 195 mg/dL (ref 0–200)
HDL: 55.6 mg/dL (ref >39.00)
LDL CALC: 113 mg/dL — AB (ref 0–99)
NonHDL: 139.62
TRIGLYCERIDES: 133 mg/dL (ref 0.0–149.0)
VLDL: 26.6 mg/dL (ref 0.0–40.0)

## 2018-03-08 LAB — URINALYSIS, ROUTINE W REFLEX MICROSCOPIC
BILIRUBIN UA: NEGATIVE
Glucose, UA: NEGATIVE
Ketones, UA: NEGATIVE
LEUKOCYTES UA: NEGATIVE
Nitrite, UA: NEGATIVE
PH UA: 7.5 (ref 5.0–7.5)
PROTEIN UA: NEGATIVE
RBC, UA: NEGATIVE
Specific Gravity, UA: <=1.005 — AB (ref 1.005–1.030)
Urobilinogen, Ur: 0.2 mg/dL (ref 0.2–1.0)

## 2018-03-11 ENCOUNTER — Telehealth: Payer: Self-pay

## 2018-03-11 ENCOUNTER — Encounter: Payer: Self-pay | Admitting: Internal Medicine

## 2018-03-11 NOTE — Telephone Encounter (Signed)
Copied from CRM [id number]. Topic: Quick Communication - Rx Refill/Question >> Mar 11, 2018  9:22 AM Crist InfanteHarrald, Kathy J wrote: Medication: an anti depressant  Pt states at her last OV, she discussed going on an anti depressant. (02/08/2018)  Pt states the dr offered several suggestions.  Pt reports she is not taking the ALPRAZolam (XANAX) 0.25 MG tablet.  Pt states not what she needs. Please advise if pt can try anything, and if she needs to make an appt   [facility] [phone] - [place] - [address] [facility] [address] [phone] (Phone) [phone] (Fax)

## 2018-03-11 NOTE — Telephone Encounter (Signed)
Please advise 

## 2018-03-12 ENCOUNTER — Encounter: Payer: Self-pay | Admitting: Internal Medicine

## 2018-03-12 ENCOUNTER — Other Ambulatory Visit: Payer: Self-pay | Admitting: Internal Medicine

## 2018-03-12 ENCOUNTER — Ambulatory Visit: Payer: Self-pay

## 2018-03-12 DIAGNOSIS — F329 Major depressive disorder, single episode, unspecified: Secondary | ICD-10-CM

## 2018-03-12 DIAGNOSIS — F32A Depression, unspecified: Secondary | ICD-10-CM

## 2018-03-12 DIAGNOSIS — F419 Anxiety disorder, unspecified: Principal | ICD-10-CM

## 2018-03-12 MED ORDER — SERTRALINE HCL 50 MG PO TABS: 50.0000 mg | ORAL_TABLET | Freq: Every day | ORAL | 2 refills | Status: DC

## 2018-03-12 NOTE — Telephone Encounter (Signed)
Sent zoloft 50 mg daily in am will need to make f/u appt in 6 weeks   TMS

## 2018-03-12 NOTE — Telephone Encounter (Signed)
Patient called in with c/o "depression." She says "the last time I was in the office, I talked to Dr. Shirlee Poole about my situation and she mentioned long term or as needed medicine for anxiety. I chose the as needed and she ordered Xanax. The Xanax only makes me sleepy. It doesn't help with the feelings I'm having. 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 I am having feelings of sadness, crying a lot, not able to function, not able to concentrate, trouble sleeping, and it's affecting my work. My thoughts drift off to thoughts of despair as to what the future holds, and it's starting to consume my thoughts. I don't have thoughts of suicide or harming myself or others. I started seeing a therapist at [facility] in [place], her name is Megan Poole. I see her again tomorrow." I asked about other symptoms, she denies. According to protocol, see PCP within 24 hours. Patient had already sent a message to Dr. Shirlee Poole earlier today and called on yesterday. As I was talking to the patient about the schedule, Dr. Shirlee Poole addressed the patient's message, advised Zoloft 50 mg QAM was ordered for a 30 day supply with 3 refills, advised to follow up in 6 weeks. She asked if I could go ahead and schedule the follow up. No availability in 6 weeks with PCP, no availability until 2 weeks before patient's scheduled appointment on 06/10/18. She asks would it be ok to follow up at that time, I advised it would be ok to discuss with the provider on 06/10/18, but if the medication is not working for her after a couple of weeks, then call the office or send Dr. Shirlee Poole another message, not wait until 06/10/18, she verbalized understanding.    Reason for Disposition . Symptoms interfere with work or school  Answer Assessment - Initial Assessment Questions 1. CONCERN: "What happened that made you call today?"     Situation depression 2. DEPRESSION SYMPTOM SCREENING: "How are you feeling overall?" (e.g., decreased  energy, increased sleeping or difficulty sleeping, difficulty concentrating, feelings of sadness, guilt, hopelessness, or worthlessness)     Feelings of sadness, crying, unable to function, difficulty sleeping and concentration, affecting my work 3. RISK OF HARM - SUICIDAL IDEATION:  "Do you ever have thoughts of hurting or killing yourself?"  (e.g., yes, no, no but preoccupation with thoughts about death) No   - INTENT:  "Do you have thoughts of hurting or killing yourself right NOW?" (e.g., yes, no, N/A) No   - PLAN: "Do you have a specific plan for how you would do this?" (e.g., gun, knife, overdose, no plan, N/A)     N/A 4. RISK OF HARM - HOMICIDAL IDEATION:  "Do you ever have thoughts of hurting or killing someone else?"  (e.g., yes, no, no but preoccupation with thoughts about death)No   - INTENT:  "Do you have thoughts of hurting or killing someone right NOW?" (e.g., yes, no, N/A) No   - PLAN: "Do you have a specific plan for how you would do this?" (e.g., gun, knife, no plan, N/A)      N/A 5. FUNCTIONAL IMPAIRMENT: "How have things been going for you overall in your life? Have you had any more difficulties than usual doing your normal daily activities?"  (e.g., better, same, worse; self-care, school, work, interactions)     More difficulties at work, hard to concentrate, thoughts drift off to despair, starting to consume thoughts 6. SUPPORT: "Who is with you now?" "Who do you  live with?" "Do you have family or friends nearby who you can talk to?"      Yes 7. THERAPIST: "Do you have a counselor or therapist? Name?"     Yes; Megan Poole at [facility] in [place] 8. STRESSORS: "Has there been any new stress or recent changes in your life?"     Yes, infidelity of husband 9. DRUG ABUSE/ALCOHOL: "Do you drink alcohol or use any illegal drugs?"      Occassional alcohol, no drugs 10. OTHER: "Do you have any other health or medical symptoms right now?" (e.g., fever)       No 11. PREGNANCY: "Is  there any chance you are pregnant?" "When was your last menstrual period?"       No  Protocols used: DEPRESSION-A-AH

## 2018-03-14 LAB — MEASLES/MUMPS/RUBELLA IMMUNITY
Mumps IgG: 82.6 AU/mL
RUBELLA: 1.82 {index}
Rubeola IgG: >300 AU/mL

## 2018-03-14 LAB — HEPATITIS B SURFACE ANTIBODY, QUANTITATIVE: Hepatitis B-Post: 29 m[IU]/mL (ref >=10)

## 2018-06-10 ENCOUNTER — Ambulatory Visit: Payer: BC Managed Care – PPO | Admitting: Internal Medicine

## 2018-06-11 ENCOUNTER — Other Ambulatory Visit: Payer: Self-pay | Admitting: Internal Medicine

## 2018-06-11 DIAGNOSIS — F32A Depression, unspecified: Secondary | ICD-10-CM

## 2018-06-11 DIAGNOSIS — F329 Major depressive disorder, single episode, unspecified: Secondary | ICD-10-CM

## 2018-06-11 DIAGNOSIS — F419 Anxiety disorder, unspecified: Principal | ICD-10-CM

## 2018-06-11 MED ORDER — SERTRALINE HCL 50 MG PO TABS: 50.0000 mg | ORAL_TABLET | Freq: Every day | ORAL | 1 refills | Status: DC

## 2018-06-12 ENCOUNTER — Encounter: Payer: Self-pay | Admitting: Internal Medicine

## 2018-06-12 ENCOUNTER — Ambulatory Visit: Payer: BC Managed Care – PPO | Admitting: Internal Medicine

## 2018-06-12 ENCOUNTER — Other Ambulatory Visit (HOSPITAL_COMMUNITY)
Admission: RE | Admit: 2018-06-12 | Discharge: 2018-06-12 | Disposition: A | Discharge status: Home / Self Care | Payer: BC Managed Care – PPO | Source: Ambulatory Visit | Attending: Internal Medicine | Admitting: Internal Medicine

## 2018-06-12 VITALS — BP 110/76 | HR 72 | Temp 98.6°F | Ht 64.0 in | Wt 113.6 lb

## 2018-06-12 DIAGNOSIS — F32A Depression, unspecified: Secondary | ICD-10-CM

## 2018-06-12 DIAGNOSIS — D751 Secondary polycythemia: Secondary | ICD-10-CM

## 2018-06-12 DIAGNOSIS — L918 Other hypertrophic disorders of the skin: Secondary | ICD-10-CM | POA: Diagnosis not present

## 2018-06-12 DIAGNOSIS — E039 Hypothyroidism, unspecified: Secondary | ICD-10-CM | POA: Diagnosis not present

## 2018-06-12 DIAGNOSIS — F419 Anxiety disorder, unspecified: Secondary | ICD-10-CM

## 2018-06-12 DIAGNOSIS — Z23 Encounter for immunization: Secondary | ICD-10-CM | POA: Diagnosis not present

## 2018-06-12 DIAGNOSIS — F329 Major depressive disorder, single episode, unspecified: Secondary | ICD-10-CM

## 2018-06-12 MED ORDER — SERTRALINE HCL 50 MG PO TABS: 50.0000 mg | ORAL_TABLET | Freq: Every day | ORAL | 1 refills | Status: DC

## 2018-06-12 NOTE — Patient Instructions (Addendum)
Vitamin D3 1000 IU daily  Calcium total 1200 mg daily   Your pap is due if it was done in 2013  Consider shingles vaccine here 2 doses read below  Call to schedule colonoscopy please    Recombinant Zoster (Shingles) Vaccine, RZV: What You Need to Know 1. Why get vaccinated? Shingles (also called herpes zoster, or just zoster) is a painful skin rash, often with blisters. Shingles is caused by the varicella zoster virus, the same virus that causes chickenpox. After you have chickenpox, the virus stays in your body and can cause shingles later in life. You can't catch shingles from another person. However, a person who has never had chickenpox (or chickenpox vaccine) could get chickenpox from someone with shingles. A shingles rash usually appears on one side of the face or body and heals within 2 to 4 weeks. Its main symptom is pain, which can be severe. Other symptoms can include fever, headache, chills and upset stomach. Very rarely, a shingles infection can lead to pneumonia, hearing problems, blindness, brain inflammation (encephalitis), or death. For about 1 person in 5, severe pain can continue even long after the rash has cleared up. This long-lasting pain is called post-herpetic neuralgia (PHN). Shingles is far more common in people 55 years of age and older than in younger people, and the risk increases with age. It is also more common in people whose immune system is weakened because of a disease such as cancer, or by drugs such as steroids or chemotherapy. At least 1 million people a year in the [place] get shingles. 2. Shingles vaccine (recombinant) Recombinant shingles vaccine was approved by FDA in 2017 for the prevention of shingles. In clinical trials, it was more than 90% effective in preventing shingles. It can also reduce the likelihood of PHN. Two doses, 2 to 6 months apart, are recommended for adults 50 and older. This vaccine is also recommended for people who have already  gotten the live shingles vaccine (Zostavax). There is no live virus in this vaccine. 3. Some people should not get this vaccine Tell your vaccine provider if you:  Have any severe, life-threatening allergies. A person who has ever had a life-threatening allergic reaction after a dose of recombinant shingles vaccine, or has a severe allergy to any component of this vaccine, may be advised not to be vaccinated. Ask your health care provider if you want information about vaccine components.  Are pregnant or breastfeeding. There is not much information about use of recombinant shingles vaccine in pregnant or nursing women. Your healthcare provider might recommend delaying vaccination.  Are not feeling well. If you have a mild illness, such as a cold, you can probably get the vaccine today. If you are moderately or severely ill, you should probably wait until you recover. Your doctor can advise you.  4. Risks of a vaccine reaction With any medicine, including vaccines, there is a chance of reactions. After recombinant shingles vaccination, a person might experience:  Pain, redness, soreness, or swelling at the site of the injection  Headache, muscle aches, fever, shivering, fatigue  In clinical trials, most people got a sore arm with mild or moderate pain after vaccination, and some also had redness and swelling where they got the shot. Some people felt tired, had muscle pain, a headache, shivering, fever, stomach pain, or nausea. About 1 out of 6 people who got recombinant zoster vaccine experienced side effects that prevented them from doing regular activities. Symptoms went away on their  own in about 2 to 3 days. Side effects were more common in younger people. You should still get the second dose of recombinant zoster vaccine even if you had one of these reactions after the first dose. Other things that could happen after this vaccine:  People sometimes faint after medical procedures, including  vaccination. Sitting or lying down for about 15 minutes can help prevent fainting and injuries caused by a fall. Tell your provider if you feel dizzy or have vision changes or ringing in the ears.  Some people get shoulder pain that can be more severe and longer-lasting than routine soreness that can follow injections. This happens very rarely.  Any medication can cause a severe allergic reaction. Such reactions to a vaccine are estimated at about 1 in a million doses, and would happen within a few minutes to a few hours after the vaccination. As with any medicine, there is a very remote chance of a vaccine causing a serious injury or death. The safety of vaccines is always being monitored. For more information, visit: http://floyd.org/ 5. What if there is a serious problem? What should I look for?  Look for anything that concerns you, such as signs of a severe allergic reaction, very high fever, or unusual behavior. Signs of a severe allergic reaction can include hives, swelling of the face and throat, difficulty breathing, a fast heartbeat, dizziness, and weakness. These would usually start a few minutes to a few hours after the vaccination. What should I do?  If you think it is a severe allergic reaction or other emergency that can't wait, call 9-1-1 and get to the nearest hospital. Otherwise, call your health care provider. Afterward, the reaction should be reported to the Vaccine Adverse Event Reporting System (VAERS). Your doctor should file this report, or you can do it yourself through the VAERS web site atwww.vaers.https://coleman.net/ by calling 539 040 2729. VAERS does not give medical advice. 6. How can I learn more?  Ask your healthcare provider. He or she can give you the vaccine package insert or suggest other sources of information.  Call your local or state health department.  Contact the Centers for Disease Control and Prevention (CDC): ? Call (231)252-4500 (1-800-CDC-INFO)  or ? Visit the CDC's website at PicCapture.uy CDC Vaccine Information Statement (VIS) Recombinant Zoster Vaccine (10/23/2016) This information is not intended to replace advice given to you by your health care provider. Make sure you discuss any questions you have with your health care provider. Document Released: 11/07/2016 Document Revised: 11/07/2016 Document Reviewed: 11/07/2016 Elsevier Interactive Patient Education  2018 [employer].   DTaP Vaccine (Diphtheria, Tetanus, and Pertussis): What You Need to Know 1. Why get vaccinated? Diphtheria, tetanus, and pertussis are serious diseases caused by bacteria. Diphtheria and pertussis are spread from person to person. Tetanus enters the body through cuts or wounds. DIPHTHERIA causes a thick covering in the back of the throat.  It can lead to breathing problems, paralysis, heart failure, and even death.  TETANUS (Lockjaw) causes painful tightening of the muscles, usually all over the body.  It can lead to "locking" of the jaw so the victim cannot open his mouth or swallow. Tetanus leads to death in up to 2 out of 10 cases.  PERTUSSIS (Whooping Cough) causes coughing spells so bad that it is hard for infants to eat, drink, or breathe. These spells can last for weeks.  It can lead to pneumonia, seizures (jerking and staring spells), brain damage, and death.  Diphtheria, tetanus, and pertussis  vaccine (DTaP) can help prevent these diseases. Most children who are vaccinated with DTaP will be protected throughout childhood. Many more children would get these diseases if we stopped vaccinating. DTaP is a safer version of an older vaccine called DTP. DTP is no longer used in the [place]. 2. Who should get DTaP vaccine and when? Children should get 5 doses of DTaP vaccine, one dose at each of the following ages:  2 months  4 months  6 months  15-18 months  4-6 years  DTaP may be given at the same time as other  vaccines. 3. Some children should not get DTaP vaccine or should wait  Children with minor illnesses, such as a cold, may be vaccinated. But children who are moderately or severely ill should usually wait until they recover before getting DTaP vaccine.  Any child who had a life-threatening allergic reaction after a dose of DTaP should not get another dose.  Any child who suffered a brain or nervous system disease within 7 days after a dose of DTaP should not get another dose.  Talk with your doctor if your child: ? had a seizure or collapsed after a dose of DTaP, ? cried non-stop for 3 hours or more after a dose of DTaP, ? had a fever over 105F after a dose of DTaP. Ask your doctor for more information. Some of these children should not get another dose of pertussis vaccine, but may get a vaccine without pertussis, called DT. 4. Older children and adults DTaP is not licensed for adolescents, adults, or children 31 years of age and older. But older people still need protection. A vaccine called Tdap is similar to DTaP. A single dose of Tdap is recommended for people 11 through 53 years of age. Another vaccine, called Td, protects against tetanus and diphtheria, but not pertussis. It is recommended every 10 years. There are separate Vaccine Information Statements for these vaccines. 5. What are the risks from DTaP vaccine? Getting diphtheria, tetanus, or pertussis disease is much riskier than getting DTaP vaccine. However, a vaccine, like any medicine, is capable of causing serious problems, such as severe allergic reactions. The risk of DTaP vaccine causing serious harm, or death, is extremely small. Mild problems (common)  Fever (up to about 1 child in 4)  Redness or swelling where the shot was given (up to about 1 child in 4)  Soreness or tenderness where the shot was given (up to about 1 child in 4) These problems occur more often after the 4th and 5th doses of the DTaP series than after  earlier doses. Sometimes the 4th or 5th dose of DTaP vaccine is followed by swelling of the entire arm or leg in which the shot was given, lasting 1-7 days (up to about 1 child in 30). Other mild problems include:  Fussiness (up to about 1 child in 3)  Tiredness or poor appetite (up to about 1 child in 10)  Vomiting (up to about 1 child in 50) These problems generally occur 1-3 days after the shot. Moderate problems (uncommon)  Seizure (jerking or staring) (about 1 child out of 14,000)  Non-stop crying, for 3 hours or more (up to about 1 child out of 1,000)  High fever, over 105F (about 1 child out of 16,000) Severe problems (very rare)  Serious allergic reaction (less than 1 out of a million doses)  Several other severe problems have been reported after DTaP vaccine. These include: ? Long-term seizures, coma, or  lowered consciousness ? Permanent brain damage. These are so rare it is hard to tell if they are caused by the vaccine. Controlling fever is especially important for children who have had seizures, for any reason. It is also important if another family member has had seizures. You can reduce fever and pain by giving your child an aspirin-free pain reliever when the shot is given, and for the next 24 hours, following the package instructions. 6. What if there is a serious reaction? What should I look for? Look for anything that concerns you, such as signs of a severe allergic reaction, very high fever, or behavior changes. Signs of a severe allergic reaction can include hives, swelling of the face and throat, difficulty breathing, a fast heartbeat, dizziness, and weakness. These would start a few minutes to a few hours after the vaccination. What should I do?  If you think it is a severe allergic reaction or other emergency that can't wait, call 9-1-1 or get the person to the nearest hospital. Otherwise, call your doctor.  Afterward, the reaction should be reported to the  Vaccine Adverse Event Reporting System (VAERS). Your doctor might file this report, or you can do it yourself through the VAERS web site at www.vaers.LAgents.no, or by calling 1-920-119-9469. ? VAERS is only for reporting reactions. They do not give medical advice. 7. The National Vaccine Injury Compensation Program The [employer] Vaccine Injury Compensation Program (VICP) is a federal program that was created to compensate people who may have been injured by certain vaccines. Persons who believe they may have been injured by a vaccine can learn about the program and about filing a claim by calling 1-2485319405 or visiting the VICP website at SpiritualWord.at. 8. How can I learn more?  Ask your doctor.  Call your local or state health department.  Contact the Centers for Disease Control and Prevention (CDC): ? Call 702-855-5936 (1-800-CDC-INFO) or ? Visit CDC's website at PicCapture.uy CDC DTaP Vaccine (Diphtheria, Tetanus, and Pertussis) VIS (01/25/06) This information is not intended to replace advice given to you by your health care provider. Make sure you discuss any questions you have with your health care provider. Document Released: 06/25/2006 Document Revised: 05/18/2016 Document Reviewed: 05/18/2016 Elsevier Interactive Patient Education  2017 Elsevier Inc.   Seborrheic Keratosis Seborrheic keratosis is a common, noncancerous (benign) skin growth. This condition causes waxy, rough, tan, brown, or black spots to appear on the skin. These skin growths can be flat or raised. What are the causes? The cause of this condition is not known. What increases the risk? This condition is more likely to develop in:  People who have a family history of seborrheic keratosis.  People who are 91 or older.  People who are pregnant.  People who have had estrogen replacement therapy.  What are the signs or symptoms? This condition often occurs on the face, chest,  shoulders, back, or other areas. These growths:  Are usually painless, but may become irritated and itchy.  Can be yellow, brown, black, or other colors.  Are slightly raised or have a flat surface.  Are sometimes rough or wart-like in texture.  Are often waxy on the surface.  Are round or oval-shaped.  Sometimes look like they are "stuck on."  Often occur in groups, but may occur as a single growth.  How is this diagnosed? This condition is diagnosed with a medical history and physical exam. A sample of the growth may be tested (skin biopsy). You may need to see  a skin specialist (dermatologist). How is this treated? Treatment is not usually needed for this condition, unless the growths are irritated or are often bleeding. You may also choose to have the growths removed if you do not like their appearance. Most commonly, these growths are treated with a procedure in which liquid nitrogen is applied to "freeze" off the growth (cryosurgery). They may also be burned off with electricity or cut off. Follow these instructions at home:  Watch your growth for any changes.  Keep all follow-up visits as told by your health care provider. This is important.  Do not scratch or pick at the growth or growths. This can cause them to become irritated or infected. Contact a health care provider if:  You suddenly have many new growths.  Your growth bleeds, itches, or hurts.  Your growth suddenly becomes larger or changes color. This information is not intended to replace advice given to you by your health care provider. Make sure you discuss any questions you have with your health care provider. Document Released: 09/30/2010 Document Revised: 02/03/2016 Document Reviewed: 01/13/2015 Elsevier Interactive Patient Education  2018 [employer].

## 2018-06-12 NOTE — Progress Notes (Addendum)
No chief complaint on file.  F/u  1. Hypothyroidism postablationTSH 04/18/18 was 0.227 on levo 75 mg and 100 mg on sundays only she has since cut dose ot 75 mg qd will recheck TSH today  2. Needs doctors note for work  3. Anxiety depression on zoloft 50 mg tried 100 mg x 2 days and wants to see if helps  4. Wants left axilla skin tag removed today 5. Has not sch colonoscopy   Review of Systems  Constitutional: Negative for weight loss.  HENT: Negative for hearing loss.   Eyes: Negative for blurred vision.  Respiratory: Negative for shortness of breath.   Cardiovascular: Negative for chest pain.  Gastrointestinal: Negative for abdominal pain.  Musculoskeletal: Negative for falls.  Skin: Negative for rash.       +left axilla skin tag   Neurological: Negative for headaches.  Psychiatric/Behavioral: Negative for depression. The patient is nervous/anxious.    Past Medical History:  Diagnosis Date  . Anxiety   . Hyperlipidemia   . Thyroid disease    graves tx'ed now with hypothyroidism   . Tobacco abuse    Past Surgical History:  Procedure Laterality Date  . BREAST BIOPSY Right early 2000s   benign  . BREAST SURGERY Right 2000   biopsy - benign  . OVARIAN CYST REMOVAL    . SALPINGOOPHORECTOMY Right 2009   ovarian cyst   Family History  Problem Relation Age of Onset  . Heart disease Father 90       CAD   . Cancer Father        lung, smoker   . Cancer Sister        cervical cancer  . Hypertension Paternal Grandmother    Social History   Socioeconomic History  . Marital status: Married    Spouse name: Economist  . Number of children: 1  . Years of education: 47  . Highest education level: Not on file  Occupational History  . 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 Financial resource strain: Not on file  . Food insecurity:    Worry: Not on file    Inability: Not on file  .  Transportation needs:    Medical: Not on file    Non-medical: Not on file  Tobacco Use  . Smoking status: Current Every Day Smoker    Packs/day: 1.00    Years: 30.00    Pack years: 30.00    Types: Cigarettes  . Smokeless tobacco: Never Used  . Tobacco comment: Patches  Substance and Sexual Activity  . Alcohol use: No    Alcohol/week: 0.0 standard drinks    Comment: occasional use  . Drug use: No  . Sexual activity: Not Currently  Lifestyle  . Physical activity:    Days per week: Not on file    Minutes per session: Not on file  . Stress: Not on file  Relationships  . Social connections:    Talks on phone: Not on file    Gets together: Not on file    Attends religious service: Not on file    Active member of club or organization: Not on file    Attends meetings of clubs or organizations: Not on file    Relationship status: Not on file  . Intimate partner violence:    Fear of current or ex partner: Not on file    Emotionally abused: Not on  file    Physically abused: Not on file    Forced sexual activity: Not on file  Other Topics Concern  . Not on file  Social History Narrative   Shanty was born and reared in [place]. She graduated high school from [employer]. She lives in [place] with her husband of 6 years. She has a 4 year old daughter who has 3 kids. 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 She enjoys camping, boating, riding horses. 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    No outpatient medications have been marked as taking for the 06/12/18 encounter (Appointment) with McLean-Scocuzza, Nino Glow, MD.   No Known Allergies No results found for this or any previous visit (from the past 2160 hour(s)). Objective  There is no height or weight on file to calculate BMI. Wt Readings from Last 3 Encounters:  02/08/18 118 lb 12.8 oz (53.9 kg)  01/25/18 119 lb 4 oz (54.1 kg)  01/17/18 119 lb (54 kg)   Temp Readings from  Last 3 Encounters:  02/08/18 98.3 F (36.8 C) (Oral)  01/25/18 99.1 F (37.3 C) (Oral)  08/13/15 98 F (36.7 C) (Oral)   BP Readings from Last 3 Encounters:  02/08/18 130/78  01/25/18 118/82  01/17/18 130/80   Pulse Readings from Last 3 Encounters:  02/08/18 91  01/25/18 93  08/13/15 72    Physical Exam  Constitutional: She is oriented to person, place, and time. Vital signs are normal. She appears well-developed and well-nourished. She is cooperative.  HENT:  Head: Normocephalic and atraumatic.  Mouth/Throat: Oropharynx is clear and moist and mucous membranes are normal.  Eyes: Pupils are equal, round, and reactive to light. Conjunctivae are normal.  Cardiovascular: Normal rate, regular rhythm and normal heart sounds.  Pulmonary/Chest: Effort normal and breath sounds normal.  Neurological: She is alert and oriented to person, place, and time. Gait normal.  Skin: Skin is warm, dry and intact.     Left axilla 0.3 cm skin tag   Psychiatric: She has a normal mood and affect. Her speech is normal and behavior is normal. Judgment and thought content normal. Cognition and memory are normal.  Vitals reviewed.   Assessment   1. Hypothyroidism postablative  2. Anxiety/depression  3. Left axilla skin tag  4. HM Plan   1. Levo 75 mcg qd  F/u [facility] endocrine yearly from 04/2018 or if TSH controlled can f/u with PCP  Check TSH today  2. Increase zoloft to 100 mg qd if this too much reduce to 75 mg qd  3. Consented for procedure no local anesthesia used today tolerated procedure  Cleaned with betadine and removed with blade and tweezers Sent pathology  4.  Will get flu shot at work  Tdap given today  MMR immune  Disc shingrix again   mammo 02/27/18 negative  Pap had 2017 [facility] due 2020 s/p right ovary and FT removal only  -pap 09/30/15 neg pap neg hpv rec pt call and sch colonoscopy  rec smoking cessation check CBC today   Note for work today    Provider: Dr. Olivia Mackie  [facility]

## 2018-06-13 LAB — CBC WITH DIFFERENTIAL/PLATELET
BASOS ABS: 0.1 10*3/uL (ref 0.0–0.1)
Basophils Relative: 1.1 % (ref 0.0–3.0)
Eosinophils Absolute: 0 10*3/uL (ref 0.0–0.7)
Eosinophils Relative: 0.2 % (ref 0.0–5.0)
HEMATOCRIT: 42.7 % (ref 36.0–46.0)
HEMOGLOBIN: 14.1 g/dL (ref 12.0–15.0)
LYMPHS PCT: 26.7 % (ref 12.0–46.0)
Lymphs Abs: 2.5 10*3/uL (ref 0.7–4.0)
MCHC: 33.1 g/dL (ref 30.0–36.0)
MCV: 96.8 fl (ref 78.0–100.0)
MONOS PCT: 5.3 % (ref 3.0–12.0)
Monocytes Absolute: 0.5 10*3/uL (ref 0.1–1.0)
NEUTROS PCT: 66.7 % (ref 43.0–77.0)
Neutro Abs: 6.1 10*3/uL (ref 1.4–7.7)
Platelets: 225 10*3/uL (ref 150.0–400.0)
RBC: 4.41 Mil/uL (ref 3.87–5.11)
RDW: 13.7 % (ref 11.5–15.5)
WBC: 9.2 10*3/uL (ref 4.0–10.5)

## 2018-06-13 LAB — TSH: TSH: 0.52 u[IU]/mL (ref 0.35–4.50)

## 2018-06-24 ENCOUNTER — Encounter: Payer: Self-pay | Admitting: Internal Medicine

## 2018-07-31 ENCOUNTER — Encounter: Payer: Self-pay | Admitting: Internal Medicine

## 2018-07-31 ENCOUNTER — Other Ambulatory Visit: Payer: Self-pay | Admitting: Internal Medicine

## 2018-07-31 DIAGNOSIS — F419 Anxiety disorder, unspecified: Principal | ICD-10-CM

## 2018-07-31 DIAGNOSIS — F329 Major depressive disorder, single episode, unspecified: Secondary | ICD-10-CM

## 2018-07-31 DIAGNOSIS — F32A Depression, unspecified: Secondary | ICD-10-CM

## 2018-07-31 MED ORDER — SERTRALINE HCL 50 MG PO TABS: 50.0000 mg | ORAL_TABLET | Freq: Every day | ORAL | 3 refills | Status: DC

## 2018-07-31 MED ORDER — SERTRALINE HCL 100 MG PO TABS: 100.0000 mg | ORAL_TABLET | Freq: Every day | ORAL | 3 refills | Status: DC

## 2019-06-12 ENCOUNTER — Other Ambulatory Visit: Payer: Self-pay

## 2019-06-12 ENCOUNTER — Encounter: Payer: Self-pay | Admitting: Internal Medicine

## 2019-06-12 ENCOUNTER — Ambulatory Visit (INDEPENDENT_AMBULATORY_CARE_PROVIDER_SITE_OTHER): Payer: BC Managed Care – PPO | Admitting: Internal Medicine

## 2019-06-12 VITALS — Ht 64.0 in | Wt 120.0 lb

## 2019-06-12 DIAGNOSIS — Z1389 Encounter for screening for other disorder: Secondary | ICD-10-CM

## 2019-06-12 DIAGNOSIS — E039 Hypothyroidism, unspecified: Secondary | ICD-10-CM

## 2019-06-12 DIAGNOSIS — E890 Postprocedural hypothyroidism: Secondary | ICD-10-CM

## 2019-06-12 DIAGNOSIS — Z0000 Encounter for general adult medical examination without abnormal findings: Secondary | ICD-10-CM | POA: Diagnosis not present

## 2019-06-12 DIAGNOSIS — E0590 Thyrotoxicosis, unspecified without thyrotoxic crisis or storm: Secondary | ICD-10-CM

## 2019-06-12 DIAGNOSIS — Z124 Encounter for screening for malignant neoplasm of cervix: Secondary | ICD-10-CM | POA: Diagnosis not present

## 2019-06-12 DIAGNOSIS — Z13220 Encounter for screening for lipoid disorders: Secondary | ICD-10-CM

## 2019-06-12 MED ORDER — LEVOTHYROXINE SODIUM 75 MCG PO TABS: 75.0000 ug | ORAL_TABLET | Freq: Every day | ORAL | 3 refills | Status: DC

## 2019-06-12 NOTE — Progress Notes (Signed)
Virtual Visit via Video Note  I connected with Megan Poole   on 06/12/19 at 11:00 AM EDT by a video enabled telemedicine application and verified that I am speaking with the correct person using two identifiers.  Location patient: work  Environmental manager or home office Persons participating in the virtual visit: patient, provider  I discussed the limitations of evaluation and management by telemedicine and the availability of in person appointments. The patient expressed understanding and agreed to proceed.   HPI:  1. Annual doing well  2. Anxiety controlled off zoloft 100 mg qd  3. Postablative hypothyroidism on levo 75 mcg needs refill and labs for the year   ROS: See pertinent positives and negatives per HPI. General wt stable  HEENT: no sore throat  CV: no chest pain  Lungs; no sob  GI: no blood  GU: no issues  MSK: no pain  Psych: no anxiety/depression  Skin: no issues  Neuro: no h/a    Past Medical History:  Diagnosis Date  . Anxiety   . Hyperlipidemia   . Thyroid disease    graves tx'ed now with hypothyroidism   . Tobacco abuse     Past Surgical History:  Procedure Laterality Date  . BREAST BIOPSY Right early 2000s   benign  . BREAST SURGERY Right 2000   biopsy - benign  . OVARIAN CYST REMOVAL    . SALPINGOOPHORECTOMY Right 2009   ovarian cyst    Family History  Problem Relation Age of Onset  . Heart disease Father 61       CAD   . Cancer Father        lung, smoker   . Cancer Sister        cervical cancer  . Hypertension Paternal Grandmother     SOCIAL HX:  Megan Poole was born and reared in [place]. She graduated high school from [employer]. She lives in [place] with her Megan Poole has a 77 year old daughter who has 3 kids. 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 She enjoys camping, boating, riding horses. 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   Current Outpatient Medications:  .  ibuprofen  (ADVIL,MOTRIN) 200 MG tablet, Take 400 mg by mouth every 6 (six) hours as needed., Disp: , Rfl:  .  levothyroxine (SYNTHROID) 75 MCG tablet, Take 1 tablet (75 mcg total) by mouth daily before breakfast. 30 minutes before food, Disp: 90 tablet, Rfl: 3  EXAM:  VITALS per patient if applicable:  GENERAL: alert, oriented, appears well and in no acute distress  HEENT: atraumatic, conjunttiva clear, no obvious abnormalities on inspection of external nose and ears  NECK: normal movements of the head and neck  LUNGS: on inspection no signs of respiratory distress, breathing rate appears normal, no obvious gross SOB, gasping or wheezing  CV: no obvious cyanosis  MS: moves all visible extremities without noticeable abnormality  PSYCH/NEURO: pleasant and cooperative, no obvious depression or anxiety, speech and thought processing grossly intact  ASSESSMENT AND PLAN:  Discussed the following assessment and plan:  Annual physical exam -  sch fasting labs  Flu shot having 07/03/19 at work  Tdap utd MMR immune  Disc shingrix prev   mammo 02/27/18 negative declines for now wants to repeat in 2021  Pap had [facility] due 2020 s/p right ovary and FT removal only  -pap 09/30/15 neg pap neg hpv -referred Dr. Georgianne Fick   Disc colonoscopy -pt wants to  wait [facility] in [place]  rec smoking cessation   Hypothyroidism, unspecified type - Plan: levothyroxine (SYNTHROID) 75 MCG tablet, TSH Postablative hypothyroidism - Plan: levothyroxine (SYNTHROID) 75 MCG tablet, TSH Subclinical hyperthyroidism - Plan: levothyroxine (SYNTHROID) 75 MCG tablet, TSH   -we discussed possible serious and likely etiologies, options for evaluation and workup, limitations of telemedicine visit vs in person visit, treatment, treatment risks and precautions. Pt prefers to treat via telemedicine empirically rather then risking or undertaking an in person visit at this moment. Patient agrees to seek prompt in person care if  worsening, new symptoms arise, or if is not improving with treatment.   I discussed the assessment and treatment plan with the patient. The patient was provided an opportunity to ask questions and all were answered. The patient agreed with the plan and demonstrated an understanding of the instructions.   The patient was advised to call back or seek an in-person evaluation if the symptoms worsen or if the condition fails to improve as anticipated.  Time spent 20 minutes  Delorise Jackson, MD

## 2019-06-16 ENCOUNTER — Telehealth: Payer: Self-pay | Admitting: Obstetrics and Gynecology

## 2019-06-16 NOTE — Telephone Encounter (Signed)
[facility] referring for routine Care with AMS. Called and left voicemail for patient to call back to be schedule

## 2019-06-18 LAB — URINALYSIS, ROUTINE W REFLEX MICROSCOPIC
Bilirubin, UA: NEGATIVE
Glucose, UA: NEGATIVE
Ketones, UA: NEGATIVE
Leukocytes,UA: NEGATIVE
Nitrite, UA: NEGATIVE
Protein,UA: NEGATIVE
RBC, UA: NEGATIVE
Specific Gravity, UA: 1.005 (ref 1.005–1.030)
Urobilinogen, Ur: 0.2 mg/dL (ref 0.2–1.0)
pH, UA: 7 (ref 5.0–7.5)

## 2019-06-18 LAB — COMPREHENSIVE METABOLIC PANEL
ALT: 9 IU/L (ref 0–32)
AST: 17 IU/L (ref 0–40)
Albumin/Globulin Ratio: 2.1 (ref 1.2–2.2)
Albumin: 4.8 g/dL (ref 3.8–4.9)
Alkaline Phosphatase: 88 IU/L (ref 39–117)
BUN/Creatinine Ratio: 14 (ref 9–23)
BUN: 11 mg/dL (ref 6–24)
Bilirubin Total: 0.2 mg/dL (ref 0.0–1.2)
CO2: 24 mmol/L (ref 20–29)
Calcium: 9.9 mg/dL (ref 8.7–10.2)
Chloride: 104 mmol/L (ref 96–106)
Creatinine, Ser: 0.79 mg/dL (ref 0.57–1.00)
GFR calc Af Amer: 98 mL/min/{1.73_m2} (ref >59)
GFR calc non Af Amer: 85 mL/min/{1.73_m2} (ref >59)
Globulin, Total: 2.3 g/dL (ref 1.5–4.5)
Glucose: 91 mg/dL (ref 65–99)
Potassium: 4.7 mmol/L (ref 3.5–5.2)
Sodium: 142 mmol/L (ref 134–144)
Total Protein: 7.1 g/dL (ref 6.0–8.5)

## 2019-06-18 LAB — LIPID PANEL
Chol/HDL Ratio: 3.6 ratio (ref 0.0–4.4)
Cholesterol, Total: 210 mg/dL — ABNORMAL HIGH (ref 100–199)
HDL: 58 mg/dL (ref >39)
LDL Chol Calc (NIH): 128 mg/dL — ABNORMAL HIGH (ref 0–99)
Triglycerides: 134 mg/dL (ref 0–149)
VLDL Cholesterol Cal: 24 mg/dL (ref 5–40)

## 2019-06-18 LAB — CBC WITH DIFFERENTIAL/PLATELET
Basophils Absolute: 0.1 10*3/uL (ref 0.0–0.2)
Basos: 1 %
EOS (ABSOLUTE): 0.2 10*3/uL (ref 0.0–0.4)
Eos: 2 %
Hematocrit: 44.6 % (ref 34.0–46.6)
Hemoglobin: 15.2 g/dL (ref 11.1–15.9)
Immature Grans (Abs): 0 10*3/uL (ref 0.0–0.1)
Immature Granulocytes: 0 %
Lymphocytes Absolute: 2 10*3/uL (ref 0.7–3.1)
Lymphs: 25 %
MCH: 32.3 pg (ref 26.6–33.0)
MCHC: 34.1 g/dL (ref 31.5–35.7)
MCV: 95 fL (ref 79–97)
Monocytes Absolute: 0.6 10*3/uL (ref 0.1–0.9)
Monocytes: 7 %
Neutrophils Absolute: 5.2 10*3/uL (ref 1.4–7.0)
Neutrophils: 65 %
Platelets: 245 10*3/uL (ref 150–450)
RBC: 4.71 x10E6/uL (ref 3.77–5.28)
RDW: 12.4 % (ref 11.7–15.4)
WBC: 8 10*3/uL (ref 3.4–10.8)

## 2019-06-18 LAB — TSH: TSH: 1.44 u[IU]/mL (ref 0.450–4.500)

## 2019-06-18 NOTE — Telephone Encounter (Signed)
Called and left voice mail for patient to call back to be schedule °

## 2019-06-19 NOTE — Telephone Encounter (Signed)
Called and left voice mail for patient to call back to be schedule °

## 2019-06-19 NOTE — Telephone Encounter (Signed)
Patient scheduled 10/20 with AMS.

## 2019-07-01 ENCOUNTER — Other Ambulatory Visit (HOSPITAL_COMMUNITY)
Admission: RE | Admit: 2019-07-01 | Discharge: 2019-07-01 | Disposition: A | Discharge status: Home / Self Care | Payer: BC Managed Care – PPO | Source: Ambulatory Visit | Attending: Obstetrics and Gynecology | Admitting: Obstetrics and Gynecology

## 2019-07-01 ENCOUNTER — Encounter: Payer: Self-pay | Admitting: Obstetrics and Gynecology

## 2019-07-01 ENCOUNTER — Other Ambulatory Visit: Payer: Self-pay

## 2019-07-01 ENCOUNTER — Ambulatory Visit (INDEPENDENT_AMBULATORY_CARE_PROVIDER_SITE_OTHER): Payer: BC Managed Care – PPO | Admitting: Obstetrics and Gynecology

## 2019-07-01 VITALS — BP 126/88 | HR 93 | Ht 65.0 in | Wt 122.0 lb

## 2019-07-01 DIAGNOSIS — Z01419 Encounter for gynecological examination (general) (routine) without abnormal findings: Secondary | ICD-10-CM | POA: Diagnosis not present

## 2019-07-01 DIAGNOSIS — Z124 Encounter for screening for malignant neoplasm of cervix: Secondary | ICD-10-CM

## 2019-07-01 DIAGNOSIS — Z1239 Encounter for other screening for malignant neoplasm of breast: Secondary | ICD-10-CM

## 2019-07-01 NOTE — Progress Notes (Signed)
Gynecology Annual Exam  PCP: McLean-Scocuzza, Nino Glow, MD  Chief Complaint:  Chief Complaint  Patient presents with  . Gynecologic Exam    History of Present Illness:Patient is a 53 y.o. [id number] presents for annual exam. The patient has no complaints today.   LMP: Patient's last menstrual period was 10/22/2016. No PMB   The patient is sexually active. She denies dyspareunia.  The patient does perform self breast exams.  There is no notable family history of breast or ovarian cancer in her family.  The patient wears seatbelts: yes.   The patient has regular exercise: not asked.    The patient denies current symptoms of depression.     Review of Systems: Review of Systems  Constitutional: Negative for chills and fever.  HENT: Negative for congestion.   Respiratory: Negative for cough and shortness of breath.   Cardiovascular: Negative for chest pain and palpitations.  Gastrointestinal: Negative for abdominal pain, constipation, diarrhea, heartburn, nausea and vomiting.  Genitourinary: Negative for dysuria, frequency and urgency.  Skin: Negative for itching and rash.  Neurological: Negative for dizziness and headaches.  Endo/Heme/Allergies: Negative for polydipsia.  Psychiatric/Behavioral: Negative for depression.    Past Medical History:  Past Medical History:  Diagnosis Date  . Anxiety   . Hyperlipidemia   . Thyroid disease    graves tx'ed now with hypothyroidism   . Tobacco abuse     Past Surgical History:  Past Surgical History:  Procedure Laterality Date  . BREAST BIOPSY Right early 2000s   benign  . BREAST SURGERY Right 2000   biopsy - benign  . OVARIAN CYST REMOVAL    . SALPINGOOPHORECTOMY Right 2009   ovarian cyst    Gynecologic History:  Patient's last menstrual period was 10/22/2016. Last Pap: Results were:  09/30/2015 NIL and HR HPV negative  03/26/2012 NIL and HR HPV negative Last mammogram:  02/27/2018 BI-RAD I 09/30/2015  BI-RAD II   Obstetric History: [id number]  Family History:  Family History  Problem Relation Age of Onset  . Heart disease Father 69       CAD   . Cancer Father        lung, smoker   . Cancer Sister        cervical cancer  . Hypertension Paternal Grandmother     Social History:  Social History   Socioeconomic History  . Marital status: Married    Spouse name: Economist  . Number of children: 1  . Years of education: 74  . Highest education level: Not on file  Occupational History  . 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 Financial resource strain: Not on file  . Food insecurity    Worry: Not on file    Inability: Not on file  . Transportation needs    Medical: Not on file    Non-medical: Not on file  Tobacco Use  . Smoking status: Current Every Day Smoker    Packs/day: 1.00    Years: 30.00    Pack years: 30.00    Types: Cigarettes  . Smokeless tobacco: Never Used  . Tobacco comment: Patches  Substance and Sexual Activity  . Alcohol use: Yes    Alcohol/week: 0.0 standard drinks    Comment: occasional use  . Drug use: No  . Sexual activity: Yes    Birth control/protection: None  Lifestyle  . Physical activity  Days per week: Not on file    Minutes per session: Not on file  . Stress: Not on file  Relationships  . Social Musician on phone: Not on file    Gets together: Not on file    Attends religious service: Not on file    Active member of club or organization: Not on file    Attends meetings of clubs or organizations: Not on file    Relationship status: Not on file  . Intimate partner violence    Fear of current or ex partner: Not on file    Emotionally abused: Not on file    Physically abused: Not on file    Forced sexual activity: Not on file  Other Topics Concern  . Not on file  Social History Narrative   Zaryia was born and reared in [place]. She graduated high  school from [employer]. She lives in [place] with her Aris Everts has a 86 year old daughter who has 3 kids. 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 She enjoys camping, boating, riding horses. 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     Allergies:  No Known Allergies  Medications: Prior to Admission medications   Medication Sig Start Date End Date Taking? Authorizing Provider  ibuprofen (ADVIL,MOTRIN) 200 MG tablet Take 400 mg by mouth every 6 (six) hours as needed.   Yes [provider]  levothyroxine (SYNTHROID) 75 MCG tablet Take 1 tablet (75 mcg total) by mouth daily before breakfast. 30 minutes before food 06/12/19  Yes McLean-Scocuzza, Pasty Spillers, MD    Physical Exam Vitals: Blood pressure 126/88, pulse 93, height 5\' 5"  (1.651 m), weight 122 lb (55.3 kg), last menstrual period 10/22/2016.  General: NAD, well nourished, appears stated age HEENT: normocephalic, anicteric Thyroid: no enlargement, no palpable nodules Pulmonary: No increased work of breathing, CTAB Cardiovascular: RRR, distal pulses 2+ Breast: Breast symmetrical, no tenderness, no palpable nodules or masses, no skin or nipple retraction present, no nipple discharge.  No axillary or supraclavicular lymphadenopathy. Abdomen: NABS, soft, non-tender, non-distended.  Umbilicus without lesions.  No hepatomegaly, splenomegaly or masses palpable. No evidence of hernia  Genitourinary:  External: Normal external female genitalia.  Normal urethral meatus, normal Bartholin's and Skene's glands.    Vagina: Normal vaginal mucosa, no evidence of prolapse.    Cervix: Grossly normal in appearance, no bleeding  Uterus: Non-enlarged, mobile, normal contour.  No CMT  Adnexa: ovaries non-enlarged, no adnexal masses  Rectal: deferred  Lymphatic: no evidence of inguinal lymphadenopathy Extremities: no edema, erythema, or tenderness Neurologic: Grossly intact Psychiatric: mood  appropriate, affect full  Female chaperone present for pelvic and breast  portions of the physical exam     Assessment: 53 y.o. 53 routine annual exam  Plan: Problem List Items Addressed This Visit    None    Visit Diagnoses    Encounter for gynecological examination without abnormal finding    -  Primary   Screening for malignant neoplasm of cervix       Relevant Orders   Cytology - PAP   Breast screening       Relevant Orders   MM 3D SCREEN BREAST BILATERAL      1) Mammogram - recommend yearly screening mammogram.  Mammogram Was ordered today  2) STI screening  was notoffered and therefore not obtained  3) ASCCP guidelines and rational discussed.  Patient opts for every 3 years screening  interval  4) Osteoporosis  - per USPTF routine screening DEXA at age 53 - FRAX 10 year major fracture risk 5.9%,  10 year hip fracture risk 0.8%  5) Routine healthcare maintenance including cholesterol, diabetes screening discussed managed by PCP - 10 year ASCVD risk 1.7% (low) with optimal 1.2%   6) Colonoscopy - has not had  7) Return in about 1 year (around 06/30/2020) for annual.    Vena AustriaAndreas Benn Tarver, MD [facility], [facility] 07/01/2019, 10:01 AM

## 2019-07-01 NOTE — Patient Instructions (Signed)
[facility] [address] [place] [address]  [facility]  [address] [place] [address]  Phone: [phone]  

## 2019-07-04 LAB — CYTOLOGY - PAP
Comment: NEGATIVE
Comment: NEGATIVE
Comment: NEGATIVE
Diagnosis: NEGATIVE
HPV 16: NEGATIVE
HPV 18 / 45: NEGATIVE
High risk HPV: POSITIVE — AB

## 2019-08-15 ENCOUNTER — Ambulatory Visit: Payer: BC Managed Care – PPO | Admitting: Internal Medicine

## 2019-12-11 ENCOUNTER — Ambulatory Visit: Payer: BC Managed Care – PPO | Admitting: Internal Medicine

## 2020-05-31 ENCOUNTER — Other Ambulatory Visit: Payer: Self-pay | Admitting: Internal Medicine

## 2020-05-31 DIAGNOSIS — E890 Postprocedural hypothyroidism: Secondary | ICD-10-CM

## 2020-05-31 DIAGNOSIS — E039 Hypothyroidism, unspecified: Secondary | ICD-10-CM

## 2020-05-31 DIAGNOSIS — E0590 Thyrotoxicosis, unspecified without thyrotoxic crisis or storm: Secondary | ICD-10-CM

## 2020-05-31 MED ORDER — LEVOTHYROXINE SODIUM 75 MCG PO TABS: 75.0000 ug | ORAL_TABLET | Freq: Every day | ORAL | 0 refills | Status: DC

## 2020-07-05 ENCOUNTER — Ambulatory Visit: Payer: BC Managed Care – PPO | Admitting: Obstetrics and Gynecology

## 2020-07-07 ENCOUNTER — Ambulatory Visit (INDEPENDENT_AMBULATORY_CARE_PROVIDER_SITE_OTHER): Payer: BC Managed Care – PPO | Admitting: Obstetrics and Gynecology

## 2020-07-07 ENCOUNTER — Encounter: Payer: Self-pay | Admitting: Obstetrics and Gynecology

## 2020-07-07 ENCOUNTER — Other Ambulatory Visit: Payer: Self-pay

## 2020-07-07 ENCOUNTER — Other Ambulatory Visit (HOSPITAL_COMMUNITY)
Admission: RE | Admit: 2020-07-07 | Discharge: 2020-07-07 | Disposition: A | Discharge status: Home / Self Care | Payer: BC Managed Care – PPO | Source: Ambulatory Visit | Attending: Obstetrics and Gynecology | Admitting: Obstetrics and Gynecology

## 2020-07-07 VITALS — BP 120/80 | Ht 65.0 in | Wt 128.0 lb

## 2020-07-07 DIAGNOSIS — Z124 Encounter for screening for malignant neoplasm of cervix: Secondary | ICD-10-CM | POA: Insufficient documentation

## 2020-07-07 DIAGNOSIS — Z01419 Encounter for gynecological examination (general) (routine) without abnormal findings: Secondary | ICD-10-CM | POA: Diagnosis not present

## 2020-07-07 DIAGNOSIS — Z1239 Encounter for other screening for malignant neoplasm of breast: Secondary | ICD-10-CM

## 2020-07-07 DIAGNOSIS — Z1211 Encounter for screening for malignant neoplasm of colon: Secondary | ICD-10-CM

## 2020-07-07 NOTE — Patient Instructions (Signed)
[facility] [address] [place] [address]  [facility]  [address] [place] [address]  Phone: [phone]  

## 2020-07-07 NOTE — Progress Notes (Signed)
Gynecology Annual Exam  PCP: McLean-Scocuzza, Megan Spillers, MD  Chief Complaint:  Chief Complaint  Patient presents with   Gynecologic Exam    History of Present Illness:Patient is a 54 y.o. [id number] presents for annual exam. The patient has no complaints today.   LMP: Patient's last menstrual period was 10/22/2016. No PMB   The patient is sexually active. She denies dyspareunia.  The patient does perform self breast exams.  There is no notable family history of breast or ovarian cancer in her family.  The patient wears seatbelts: yes.   The patient has regular exercise: not asked.    The patient denies current symptoms of depression.     Review of Systems: Review of Systems  Constitutional: Negative for chills and fever.  HENT: Negative for congestion.   Respiratory: Negative for cough and shortness of breath.   Cardiovascular: Negative for chest pain and palpitations.  Gastrointestinal: Negative for abdominal pain, constipation, diarrhea, heartburn, nausea and vomiting.  Genitourinary: Negative for dysuria, frequency and urgency.  Skin: Negative for itching and rash.  Neurological: Negative for dizziness and headaches.  Endo/Heme/Allergies: Negative for polydipsia.  Psychiatric/Behavioral: Negative for depression.    Past Medical History:  Patient Active Problem List   Diagnosis Date Noted   Anxiety 02/11/2018   Annual physical exam 02/08/2018   Hypothyroidism 01/25/2018    [facility] Dr. 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 She graduated high school from [employer]. She lives in [place] with her Megan Poole has a 26 year old daughter who has 3 kids. 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 She enjoys camping, boating, riding horses. 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    Social Determinants of Health   Financial Resource Strain:    Difficulty of Paying Living Expenses: Not on file  Food Insecurity:    Worried About  Programme researcher, broadcasting/film/video in the Last Year: Not on file   [employer] of Food in the Last Year: Not on file  Transportation Needs:    Lack of Transportation (Medical): Not on file   Lack of Transportation (Non-Medical): Not on file  Physical Activity:    Days of Exercise per Week: Not on file   Minutes of Exercise per Session: Not on file  Stress:    Feeling of Stress : Not on file  Social Connections:    Frequency of Communication with Friends and Family: Not on file   Frequency of Social Gatherings with Friends and Family: Not on file   Attends Religious Services: Not on file   Active Member of Clubs or Organizations: Not on file   Attends Poole Meetings: Not on file   Marital Status: Not on file  Intimate Partner Violence:    Fear of Current or Ex-Partner: Not on file   Emotionally Abused: Not on file   Physically Abused: Not on file   Sexually Abused: Not on file    Allergies:  No Known Allergies  Medications: Prior to Admission medications   Medication Sig Start Date End Date Taking? Authorizing Provider  levothyroxine (SYNTHROID) 75 MCG tablet Take 1 tablet (75 mcg total) by mouth daily before breakfast. 30 minutes before food. appt further refills call to schedule asap 05/31/20  Yes McLean-Scocuzza, Megan Spillers, MD  ibuprofen (ADVIL,MOTRIN) 200 MG tablet Take 400 mg by mouth every 6 (six) hours as needed.    [provider]    Physical Exam Vitals: Blood pressure 120/80, height 5\' 5"  (1.651 m), weight 128 lb (58.1 kg), last menstrual period 10/22/2016.  General: NAD HEENT: normocephalic, anicteric Thyroid: no enlargement, no palpable nodules Pulmonary: No increased work of breathing, CTAB Cardiovascular: RRR, distal pulses 2+ Breast: Breast symmetrical, no tenderness, no palpable nodules or masses, no skin or nipple retraction present, no nipple discharge.  No axillary or supraclavicular lymphadenopathy. Abdomen: NABS, soft, non-tender,  non-distended.  Umbilicus without lesions.  No hepatomegaly, splenomegaly or masses palpable. No evidence of hernia  Genitourinary:  External: Normal external female genitalia.  Normal urethral meatus, normal Bartholin's and Skene's glands.    Vagina: Normal vaginal mucosa, no evidence of prolapse.    Cervix: Grossly normal in appearance, no bleeding  Uterus: Non-enlarged, mobile, normal contour.  No CMT  Adnexa: ovaries non-enlarged, no adnexal masses  Rectal: deferred  Lymphatic: no evidence of inguinal lymphadenopathy Extremities: no edema, erythema, or tenderness Neurologic: Grossly intact Psychiatric: mood appropriate, affect full  Female chaperone present for pelvic and breast  portions of the physical exam   Immunization History  Administered Date(s) Administered   Influenza-Unspecified 06/24/2018   Tdap 06/12/2018    Assessment: 54 y.o. 52 routine annual exam  Plan: Problem List Items Addressed This Visit    None    Visit Diagnoses  Encounter for gynecological examination without abnormal finding    -  Primary   Screening for malignant neoplasm of cervix       Relevant Orders   Cytology - PAP   Breast screening       Relevant Orders   MM 3D SCREEN BREAST BILATERAL   Colon cancer screening       Relevant Orders   Ambulatory referral to Gastroenterology      1) Mammogram - recommend yearly screening mammogram.  Mammogram Was ordered today  2) STI screening  was notoffered and therefore not obtained  3) ASCCP guidelines and rational discussed.  Patient opts for every 3 years screening interval - repeat secondary to NILM HPV positive last  Year  4) Osteoporosis  - per USPTF routine screening DEXA at age 38  5) Routine healthcare maintenance including cholesterol, diabetes screening discussed managed by PCP  6) Colonoscopy - ordered  7) Return in about 1 year (around 07/07/2021) for annual.    Megan Austria, MD Domingo Pulse, [facility] 07/07/2020, 3:54 PM

## 2020-07-12 LAB — CYTOLOGY - PAP
Comment: NEGATIVE
Diagnosis: NEGATIVE
High risk HPV: NEGATIVE

## 2020-07-15 ENCOUNTER — Other Ambulatory Visit: Payer: Self-pay

## 2020-07-15 ENCOUNTER — Telehealth (INDEPENDENT_AMBULATORY_CARE_PROVIDER_SITE_OTHER): Payer: Self-pay | Admitting: Gastroenterology

## 2020-07-15 DIAGNOSIS — Z1211 Encounter for screening for malignant neoplasm of colon: Secondary | ICD-10-CM

## 2020-07-15 NOTE — Progress Notes (Signed)
Gastroenterology Pre-Procedure Review  Request Date: Friday 08/27/20 Requesting Physician: Dr. Tobi Bastos  PATIENT REVIEW QUESTIONS: The patient responded to the following health history questions as indicated:    1. Are you having any GI issues? no 2. Do you have a personal history of Polyps? no 3. Do you have a family history of Colon Cancer or Polyps? no 4. Diabetes Mellitus? no 5. Joint replacements in the past 12 months?no 6. Major health problems in the past 3 months?no 7. Any artificial heart valves, MVP, or defibrillator?no    MEDICATIONS & ALLERGIES:    Patient reports the following regarding taking any anticoagulation/antiplatelet therapy:   Plavix, Coumadin, Eliquis, Xarelto, Lovenox, Pradaxa, Brilinta, or Effient? no Aspirin? no  Patient confirms/reports the following medications:  Current Outpatient Medications  Medication Sig Dispense Refill  . ibuprofen (ADVIL,MOTRIN) 200 MG tablet Take 400 mg by mouth every 6 (six) hours as needed.    Marland Kitchen levothyroxine (SYNTHROID) 75 MCG tablet Take 1 tablet (75 mcg total) by mouth daily before breakfast. 30 minutes before food. appt further refills call to schedule asap 90 tablet 0   No current facility-administered medications for this visit.    Patient confirms/reports the following allergies:  No Known Allergies  No orders of the defined types were placed in this encounter.   AUTHORIZATION INFORMATION Primary Insurance: 1D#: Group #:  Secondary Insurance: 1D#: Group #:  SCHEDULE INFORMATION: Date: Friday 08/27/20 Time: Location:[facility]

## 2020-08-10 ENCOUNTER — Encounter: Payer: Self-pay | Admitting: Internal Medicine

## 2020-08-10 ENCOUNTER — Telehealth: Payer: Self-pay

## 2020-08-10 NOTE — Telephone Encounter (Signed)
Call and check on pt and schedule virtual if needed   Thanks

## 2020-08-10 NOTE — Telephone Encounter (Signed)
Called and spoke to [place]. Chele Declines the Infusion and being seen at a local Urgent care for herself. She states that it was for her Husband.

## 2020-08-10 NOTE — Telephone Encounter (Addendum)
Called and spoke to [name] about her and her Husband's Positive Covid test. Megan Poole was tested on 08/05/2020 from a home test from [facility] and tested positive. Her symptoms started on 08/04/2020. She has no fever and a loss of taste and smell. No congestion. She is asking on how to proceed for the monoclonal antibody Infusion. Her husband's information and symptoms have been sent to his PCP.

## 2020-08-11 NOTE — Telephone Encounter (Signed)
Patient declines further treatment when speaking with Azzell, CMA.   Mychart message sent with OTC list for COVID-19.

## 2020-08-20 ENCOUNTER — Telehealth: Payer: Self-pay

## 2020-08-20 NOTE — Telephone Encounter (Signed)
Patients colonoscopy has been canceled due to COVID diagnosis 08/04/20.  Test was not performed through [facility].  Patient states she is still recovering, and would like to put off her colonoscopy until next spring.  Trish in Endo has been notified. Patient has been informed that we will re-open her referral when she is ready to reschedule.  Thanks,  [place], [place]

## 2020-08-27 ENCOUNTER — Encounter: Admission: RE | Payer: Self-pay | Source: Home / Self Care

## 2020-08-27 ENCOUNTER — Ambulatory Visit
Admission: RE | Admit: 2020-08-27 | Payer: BC Managed Care – PPO | Source: Home / Self Care | Admitting: Gastroenterology

## 2020-08-27 SURGERY — COLONOSCOPY WITH PROPOFOL
Anesthesia: General

## 2020-08-31 ENCOUNTER — Encounter: Payer: Self-pay | Admitting: Internal Medicine

## 2020-09-08 ENCOUNTER — Encounter: Payer: Self-pay | Admitting: Internal Medicine

## 2020-09-09 ENCOUNTER — Other Ambulatory Visit: Payer: Self-pay | Admitting: Internal Medicine

## 2020-09-09 DIAGNOSIS — Z1389 Encounter for screening for other disorder: Secondary | ICD-10-CM

## 2020-09-09 DIAGNOSIS — E039 Hypothyroidism, unspecified: Secondary | ICD-10-CM

## 2020-09-09 DIAGNOSIS — E538 Deficiency of other specified B group vitamins: Secondary | ICD-10-CM

## 2020-09-09 DIAGNOSIS — E890 Postprocedural hypothyroidism: Secondary | ICD-10-CM

## 2020-09-09 DIAGNOSIS — Z0000 Encounter for general adult medical examination without abnormal findings: Secondary | ICD-10-CM

## 2020-09-09 DIAGNOSIS — E0590 Thyrotoxicosis, unspecified without thyrotoxic crisis or storm: Secondary | ICD-10-CM

## 2020-09-09 DIAGNOSIS — E785 Hyperlipidemia, unspecified: Secondary | ICD-10-CM

## 2020-09-09 MED ORDER — LEVOTHYROXINE SODIUM 75 MCG PO TABS: 75.0000 ug | ORAL_TABLET | Freq: Every day | ORAL | 0 refills | Status: DC

## 2020-09-22 ENCOUNTER — Other Ambulatory Visit: Payer: Self-pay

## 2020-09-22 ENCOUNTER — Other Ambulatory Visit (INDEPENDENT_AMBULATORY_CARE_PROVIDER_SITE_OTHER): Payer: BC Managed Care – PPO

## 2020-09-22 DIAGNOSIS — Z1389 Encounter for screening for other disorder: Secondary | ICD-10-CM

## 2020-09-22 DIAGNOSIS — E538 Deficiency of other specified B group vitamins: Secondary | ICD-10-CM

## 2020-09-22 DIAGNOSIS — E039 Hypothyroidism, unspecified: Secondary | ICD-10-CM | POA: Diagnosis not present

## 2020-09-22 DIAGNOSIS — E785 Hyperlipidemia, unspecified: Secondary | ICD-10-CM

## 2020-09-22 DIAGNOSIS — Z0000 Encounter for general adult medical examination without abnormal findings: Secondary | ICD-10-CM | POA: Diagnosis not present

## 2020-09-22 LAB — CBC WITH DIFFERENTIAL/PLATELET
Basophils Absolute: 0.1 10*3/uL (ref 0.0–0.1)
Basophils Relative: 0.9 % (ref 0.0–3.0)
Eosinophils Absolute: 0.1 10*3/uL (ref 0.0–0.7)
Eosinophils Relative: 0.8 % (ref 0.0–5.0)
HCT: 42 % (ref 36.0–46.0)
Hemoglobin: 14.1 g/dL (ref 12.0–15.0)
Lymphocytes Relative: 19.8 % (ref 12.0–46.0)
Lymphs Abs: 1.7 10*3/uL (ref 0.7–4.0)
MCHC: 33.5 g/dL (ref 30.0–36.0)
MCV: 96.3 fl (ref 78.0–100.0)
Monocytes Absolute: 0.6 10*3/uL (ref 0.1–1.0)
Monocytes Relative: 7.4 % (ref 3.0–12.0)
Neutro Abs: 5.9 10*3/uL (ref 1.4–7.7)
Neutrophils Relative %: 71.1 % (ref 43.0–77.0)
Platelets: 208 10*3/uL (ref 150.0–400.0)
RBC: 4.36 Mil/uL (ref 3.87–5.11)
RDW: 13.4 % (ref 11.5–15.5)
WBC: 8.4 10*3/uL (ref 4.0–10.5)

## 2020-09-22 LAB — COMPREHENSIVE METABOLIC PANEL
ALT: 13 U/L (ref 0–35)
AST: 14 U/L (ref 0–37)
Albumin: 4.6 g/dL (ref 3.5–5.2)
Alkaline Phosphatase: 79 U/L (ref 39–117)
BUN: 15 mg/dL (ref 6–23)
CO2: 28 mEq/L (ref 19–32)
Calcium: 9.8 mg/dL (ref 8.4–10.5)
Chloride: 102 mEq/L (ref 96–112)
Creatinine, Ser: 0.82 mg/dL (ref 0.40–1.20)
GFR: 80.59 mL/min (ref >60.00)
Glucose, Bld: 132 mg/dL — ABNORMAL HIGH (ref 70–99)
Potassium: 4.3 mEq/L (ref 3.5–5.1)
Sodium: 138 mEq/L (ref 135–145)
Total Bilirubin: 0.5 mg/dL (ref 0.2–1.2)
Total Protein: 7 g/dL (ref 6.0–8.3)

## 2020-09-22 LAB — LIPID PANEL
Cholesterol: 204 mg/dL — ABNORMAL HIGH (ref 0–200)
HDL: 52.2 mg/dL (ref >39.00)
LDL Cholesterol: 120 mg/dL — ABNORMAL HIGH (ref 0–99)
NonHDL: 152.15
Total CHOL/HDL Ratio: 4
Triglycerides: 163 mg/dL — ABNORMAL HIGH (ref 0.0–149.0)
VLDL: 32.6 mg/dL (ref 0.0–40.0)

## 2020-09-22 LAB — VITAMIN B12: Vitamin B-12: 223 pg/mL (ref 211–911)

## 2020-09-22 LAB — TSH: TSH: 0.58 u[IU]/mL (ref 0.35–4.50)

## 2020-09-22 NOTE — Addendum Note (Signed)
Addended by: Bonnell Public I on: 09/22/2020 09:58 AM   Modules accepted: Orders

## 2020-09-23 ENCOUNTER — Telehealth: Payer: Self-pay

## 2020-09-23 LAB — URINALYSIS, ROUTINE W REFLEX MICROSCOPIC
Bilirubin, UA: NEGATIVE
Glucose, UA: NEGATIVE
Ketones, UA: NEGATIVE
Leukocytes,UA: NEGATIVE
Nitrite, UA: NEGATIVE
Protein,UA: NEGATIVE
RBC, UA: NEGATIVE
Specific Gravity, UA: 1.02 (ref 1.005–1.030)
Urobilinogen, Ur: 0.2 mg/dL (ref 0.2–1.0)
pH, UA: 6 (ref 5.0–7.5)

## 2020-09-23 NOTE — Telephone Encounter (Signed)
Left message to call back for lab results.

## 2020-09-24 ENCOUNTER — Other Ambulatory Visit: Payer: Self-pay

## 2020-09-24 ENCOUNTER — Ambulatory Visit (INDEPENDENT_AMBULATORY_CARE_PROVIDER_SITE_OTHER): Payer: BC Managed Care – PPO | Admitting: Internal Medicine

## 2020-09-24 ENCOUNTER — Encounter: Payer: Self-pay | Admitting: Internal Medicine

## 2020-09-24 VITALS — BP 118/80 | HR 87 | Temp 98.3°F | Ht 63.98 in | Wt 128.4 lb

## 2020-09-24 DIAGNOSIS — Z0000 Encounter for general adult medical examination without abnormal findings: Secondary | ICD-10-CM | POA: Diagnosis not present

## 2020-09-24 DIAGNOSIS — E0590 Thyrotoxicosis, unspecified without thyrotoxic crisis or storm: Secondary | ICD-10-CM | POA: Diagnosis not present

## 2020-09-24 DIAGNOSIS — Z1211 Encounter for screening for malignant neoplasm of colon: Secondary | ICD-10-CM

## 2020-09-24 DIAGNOSIS — E039 Hypothyroidism, unspecified: Secondary | ICD-10-CM

## 2020-09-24 DIAGNOSIS — E890 Postprocedural hypothyroidism: Secondary | ICD-10-CM

## 2020-09-24 DIAGNOSIS — Z1389 Encounter for screening for other disorder: Secondary | ICD-10-CM

## 2020-09-24 DIAGNOSIS — Z1231 Encounter for screening mammogram for malignant neoplasm of breast: Secondary | ICD-10-CM

## 2020-09-24 DIAGNOSIS — E785 Hyperlipidemia, unspecified: Secondary | ICD-10-CM

## 2020-09-24 MED ORDER — LEVOTHYROXINE SODIUM 75 MCG PO TABS: 75.0000 ug | ORAL_TABLET | Freq: Every day | ORAL | 3 refills | Status: DC

## 2020-09-24 NOTE — Progress Notes (Signed)
Chief Complaint  Patient presents with  . Annual Exam   Annual  1. Had covid 08/05/20 felt PND, sinus drainage and weak but doing well her husband had to be hosp x 4 days  2. Gluc >130 with labs had wheat thins prior to to labs  3. Mild right post shoulder pain today tried goody powder had had tylenol ibuprofen w/o relief   Review of Systems  Constitutional: Negative for weight loss.  HENT: Negative for hearing loss.   Eyes: Negative for blurred vision.  Respiratory: Negative for shortness of breath.   Cardiovascular: Negative for chest pain.  Gastrointestinal: Negative for abdominal pain.  Musculoskeletal: Positive for joint pain.  Skin: Negative for rash.  Neurological: Negative for headaches.  Psychiatric/Behavioral: Negative for depression.   Past Medical History:  Diagnosis Date  . Anxiety   . COVID-19    08/05/20  . Hyperlipidemia   . Thyroid disease    graves tx'ed now with hypothyroidism   . Tobacco abuse    Past Surgical History:  Procedure Laterality Date  . BREAST BIOPSY Right early 2000s   benign  . BREAST SURGERY Right 2000   biopsy - benign  . OVARIAN CYST REMOVAL    . SALPINGOOPHORECTOMY Right 2009   ovarian cyst   Family History  Problem Relation Age of Onset  . Heart disease Father 74       CAD   . Cancer Father        lung, smoker   . Cancer Sister        cervical cancer  . Hypertension Paternal Grandmother    Social History   Socioeconomic History  . Marital status: Married    Spouse name: Corporate investment banker  . Number of children: 1  . Years of education: 91  . Highest education level: Not on file  Occupational History  . 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 Smoking status: Current Every Day Smoker    Packs/day: 1.00    Years: 30.00    Pack years: 30.00    Types: Cigarettes  . Smokeless tobacco: Never Used  . Tobacco comment: Patches  Vaping Use  . Vaping  Use: Never used  Substance and Sexual Activity  . Alcohol use: Yes    Alcohol/week: 0.0 standard drinks    Comment: occasional use  . Drug use: No  . Sexual activity: Yes    Birth control/protection: None  Other Topics Concern  . Not on file  Social History Narrative   Megan Poole was born and reared in [place]. She graduated high school from [employer]. She lives in [place] with her Aris Everts has a 56 year old daughter who has 3 kids. 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 She enjoys camping, boating, riding horses. 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    Social Determinants of Health   Financial Resource Strain: Not on file  Food Insecurity: Not on file  Transportation Needs: Not on file  Physical Activity: Not on file  Stress: Not on file  Social Connections: Not on file  Intimate Partner Violence: Not on file   Current Meds  Medication Sig  . Aspirin-Acetaminophen-Caffeine (GOODYS EXTRA STRENGTH PO) Take by mouth.  Marland Kitchen ibuprofen (ADVIL,MOTRIN) 200 MG tablet Take 400 mg by mouth every 6 (six) hours as needed.  . [DISCONTINUED] levothyroxine (SYNTHROID) 75 MCG tablet Take  1 tablet (75 mcg total) by mouth daily before breakfast. 30 minutes before food. appt further refills call to schedule asap no further refills until appt   No Known Allergies Recent Results (from the past 2160 hour(s))  Cytology - PAP     Status: None   Collection Time: 07/07/20  3:38 PM  Result Value Ref Range   High risk HPV Negative    Adequacy      Satisfactory for evaluation; transformation zone component PRESENT.   Diagnosis      - Negative for intraepithelial lesion or malignancy (NILM)   Comment Normal Reference Range HPV - Negative   Vitamin B12     Status: None   Collection Time: 09/22/20  9:46 AM  Result Value Ref Range   Vitamin B-12 223 211 - 911 pg/mL  TSH     Status: None   Collection Time: 09/22/20  9:46 AM  Result Value Ref Range    TSH 0.58 0.35 - 4.50 uIU/mL  CBC with Differential/Platelet     Status: None   Collection Time: 09/22/20  9:46 AM  Result Value Ref Range   WBC 8.4 4.0 - 10.5 K/uL   RBC 4.36 3.87 - 5.11 Mil/uL   Hemoglobin 14.1 12.0 - 15.0 g/dL   HCT 42.0 36.0 - 46.0 %   MCV 96.3 78.0 - 100.0 fl   MCHC 33.5 30.0 - 36.0 g/dL   RDW 13.4 11.5 - 15.5 %   Platelets 208.0 150.0 - 400.0 K/uL   Neutrophils Relative % 71.1 43.0 - 77.0 %   Lymphocytes Relative 19.8 12.0 - 46.0 %   Monocytes Relative 7.4 3.0 - 12.0 %   Eosinophils Relative 0.8 0.0 - 5.0 %   Basophils Relative 0.9 0.0 - 3.0 %   Neutro Abs 5.9 1.4 - 7.7 K/uL   Lymphs Abs 1.7 0.7 - 4.0 K/uL   Monocytes Absolute 0.6 0.1 - 1.0 K/uL   Eosinophils Absolute 0.1 0.0 - 0.7 K/uL   Basophils Absolute 0.1 0.0 - 0.1 K/uL  Lipid panel     Status: Abnormal   Collection Time: 09/22/20  9:46 AM  Result Value Ref Range   Cholesterol 204 (H) 0 - 200 mg/dL    Comment: ATP III Classification       Desirable:  < 200 mg/dL               Borderline High:  200 - 239 mg/dL          High:  > = 240 mg/dL   Triglycerides 163.0 (H) 0.0 - 149.0 mg/dL    Comment: Normal:  <150 mg/dLBorderline High:  150 - 199 mg/dL   HDL 52.20 >39.00 mg/dL   VLDL 32.6 0.0 - 40.0 mg/dL   LDL Cholesterol 120 (H) 0 - 99 mg/dL   Total CHOL/HDL Ratio 4     Comment:                Men          Women1/2 Average Risk     3.4          3.3Average Risk          5.0          4.42X Average Risk          9.6          7.13X Average Risk          15.0          11.0  NonHDL 152.15     Comment: NOTE:  Non-HDL goal should be 30 mg/dL higher than patient's LDL goal (i.e. LDL goal of < 70 mg/dL, would have non-HDL goal of < 100 mg/dL)  Comprehensive metabolic panel     Status: Abnormal   Collection Time: 09/22/20  9:46 AM  Result Value Ref Range   Sodium 138 135 - 145 mEq/L   Potassium 4.3 3.5 - 5.1 mEq/L   Chloride 102 96 - 112 mEq/L   CO2 28 19 - 32 mEq/L   Glucose, Bld 132 (H)  70 - 99 mg/dL   BUN 15 6 - 23 mg/dL   Creatinine, Ser 0.82 0.40 - 1.20 mg/dL   Total Bilirubin 0.5 0.2 - 1.2 mg/dL   Alkaline Phosphatase 79 39 - 117 U/L   AST 14 0 - 37 U/L   ALT 13 0 - 35 U/L   Total Protein 7.0 6.0 - 8.3 g/dL   Albumin 4.6 3.5 - 5.2 g/dL   GFR 80.59 >60.00 mL/min    Comment: Calculated using the CKD-EPI Creatinine Equation (2021)   Calcium 9.8 8.4 - 10.5 mg/dL  Urinalysis, Routine w reflex microscopic     Status: None   Collection Time: 09/22/20  9:58 AM  Result Value Ref Range   Specific Gravity, UA 1.020 1.005 - 1.030   pH, UA 6.0 5.0 - 7.5   Color, UA Yellow Yellow   Appearance Ur Clear Clear   Leukocytes,UA Negative Negative   Protein,UA Negative Negative/Trace   Glucose, UA Negative Negative   Ketones, UA Negative Negative   RBC, UA Negative Negative   Bilirubin, UA Negative Negative   Urobilinogen, Ur 0.2 0.2 - 1.0 mg/dL   Nitrite, UA Negative Negative   Microscopic Examination Comment     Comment: Microscopic not indicated and not performed.   Objective  Body mass index is 22.06 kg/m. Wt Readings from Last 3 Encounters:  09/24/20 128 lb 6.4 oz (58.2 kg)  07/07/20 128 lb (58.1 kg)  07/01/19 122 lb (55.3 kg)   Temp Readings from Last 3 Encounters:  09/24/20 98.3 F (36.8 C) (Oral)  06/12/18 98.6 F (37 C) (Oral)  02/08/18 98.3 F (36.8 C) (Oral)   BP Readings from Last 3 Encounters:  09/24/20 118/80  07/07/20 120/80  07/01/19 126/88   Pulse Readings from Last 3 Encounters:  09/24/20 87  07/01/19 93  06/12/18 72    Physical Exam Vitals and nursing note reviewed.  Constitutional:      Appearance: Normal appearance. She is well-developed and well-groomed.  HENT:     Head: Normocephalic and atraumatic.  Eyes:     Conjunctiva/sclera: Conjunctivae normal.     Pupils: Pupils are equal, round, and reactive to light.  Cardiovascular:     Rate and Rhythm: Normal rate and regular rhythm.     Heart sounds: Normal heart sounds. No murmur  heard.   Pulmonary:     Effort: Pulmonary effort is normal.     Breath sounds: Normal breath sounds.  Skin:    General: Skin is warm and dry.  Neurological:     General: No focal deficit present.     Mental Status: She is alert and oriented to person, place, and time. Mental status is at baseline.     Gait: Gait normal.  Psychiatric:        Attention and Perception: Attention and perception normal.        Mood and Affect: Mood and affect normal.  Speech: Speech normal.        Behavior: Behavior normal. Behavior is cooperative.        Thought Content: Thought content normal.        Cognition and Memory: Cognition and memory normal.        Judgment: Judgment normal.     Assessment  Plan  Annual physical exam sch fasting labs  Flu shot consider in future  covid shot consider pfizer Tdap utd MMR immune  shingrix shot disc consider in future  mammo 02/27/18 negative declines for now wants to repeat in 2021  Ordered  Pap had 2017 [facility] due 2020 s/p right ovary and FT removal only -pap 09/30/15 neg pap neg hpv -referred Dr. Georgianne Fick 07/07/20 negative   Disc colonoscopy -referred [facility]  rec smoking cessation   Hypothyroidism, unspecified type - Plan: levothyroxine (SYNTHROID) 75 MCG tablet  Provider: Dr. Olivia Mackie McLean-Scocuzza-Internal Medicine

## 2020-09-24 NOTE — Patient Instructions (Addendum)
Vitamin B12 500 to 1000 mcg daily Consider pfizer vaccine, shingrix vaccine  Aspercream, bengay. voltaren gel for your shoulder   Debrox ear wax drops or HP with warm water with dropper x 1 week, 1x per month    Cholesterol Content in Foods Cholesterol is a waxy, fat-like substance that helps to carry fat in the blood. The body needs cholesterol in small amounts, but too much cholesterol can cause damage to the arteries and heart. Most people should eat less than 200 milligrams (mg) of cholesterol a day. Foods with cholesterol Cholesterol is found in animal-based foods, such as meat, seafood, and dairy. Generally, low-fat dairy and lean meats have less cholesterol than full-fat dairy and fatty meats. The milligrams of cholesterol per serving (mg per serving) of common cholesterol-containing foods are listed below. Meat and other proteins  Egg -- one large whole egg has 186 mg.  Veal shank -- 4 oz has 141 mg.  Lean ground [place] (93% lean) -- 4 oz has 118 mg.  Fat-trimmed lamb loin -- 4 oz has 106 mg.  Lean ground beef (90% lean) -- 4 oz has 100 mg.  Lobster -- 3.5 oz has 90 mg.  Pork loin chops -- 4 oz has 86 mg.  Canned salmon -- 3.5 oz has 83 mg.  Fat-trimmed beef top loin -- 4 oz has 78 mg.  Frankfurter -- 1 frank (3.5 oz) has 77 mg.  Crab -- 3.5 oz has 71 mg.  Roasted chicken without skin, white meat -- 4 oz has 66 mg.  Light bologna -- 2 oz has 45 mg.  Deli-cut [place] -- 2 oz has 31 mg.  Canned tuna -- 3.5 oz has 31 mg.  Tomasa Blase -- 1 oz has 29 mg.  Oysters and mussels (raw) -- 3.5 oz has 25 mg.  Mackerel -- 1 oz has 22 mg.  Trout -- 1 oz has 20 mg.  Pork sausage -- 1 link (1 oz) has 17 mg.  Salmon -- 1 oz has 16 mg.  Tilapia -- 1 oz has 14 mg. Dairy  Soft-serve ice cream --  cup (4 oz) has 103 mg.  Whole-milk yogurt -- 1 cup (8 oz) has 29 mg.  Cheddar cheese -- 1 oz has 28 mg.  American cheese -- 1 oz has 28 mg.  Whole milk -- 1 cup (8 oz) has 23  mg.  2% milk -- 1 cup (8 oz) has 18 mg.  Cream cheese -- 1 tablespoon (Tbsp) has 15 mg.  Cottage cheese --  cup (4 oz) has 14 mg.  Low-fat (1%) milk -- 1 cup (8 oz) has 10 mg.  Sour cream -- 1 Tbsp has 8.5 mg.  Low-fat yogurt -- 1 cup (8 oz) has 8 mg.  Nonfat Greek yogurt -- 1 cup (8 oz) has 7 mg.  Half-and-half cream -- 1 Tbsp has 5 mg. Fats and oils  Cod liver oil -- 1 tablespoon (Tbsp) has 82 mg.  Butter -- 1 Tbsp has 15 mg.  Lard -- 1 Tbsp has 14 mg.  Bacon grease -- 1 Tbsp has 14 mg.  Mayonnaise -- 1 Tbsp has 5-10 mg.  Margarine -- 1 Tbsp has 3-10 mg. Exact amounts of cholesterol in these foods may vary depending on specific ingredients and brands.   Foods without cholesterol Most plant-based foods do not have cholesterol unless you combine them with a food that has cholesterol. Foods without cholesterol include:  Grains and cereals.  Vegetables.  Fruits.  Vegetable oils, such  as olive, canola, and sunflower oil.  Legumes, such as peas, beans, and lentils.  Nuts and seeds.  Egg whites.   Summary  The body needs cholesterol in small amounts, but too much cholesterol can cause damage to the arteries and heart.  Most people should eat less than 200 milligrams (mg) of cholesterol a day. This information is not intended to replace advice given to you by your health care provider. Make sure you discuss any questions you have with your health care provider. Document Revised: 01/19/2020 Document Reviewed: 01/19/2020 Elsevier Patient Education  2021 [employer]  High Cholesterol  High cholesterol is a condition in which the blood has high levels of a white, waxy substance similar to fat (cholesterol). The liver makes all the cholesterol that the body needs. The human body needs small amounts of cholesterol to help build cells. A person gets extra or excess cholesterol from the food that he or she eats. The blood carries cholesterol from the liver to the rest  of the body. If you have high cholesterol, deposits (plaques) may build up on the walls of your arteries. Arteries are the blood vessels that carry blood away from your heart. These plaques make the arteries narrow and stiff. Cholesterol plaques increase your risk for heart attack and stroke. Work with your health care provider to keep your cholesterol levels in a healthy range. What increases the risk? The following factors may make you more likely to develop this condition:  Eating foods that are high in animal fat (saturated fat) or cholesterol.  Being overweight.  Not getting enough exercise.  A family history of high cholesterol (familial hypercholesterolemia).  Use of tobacco products.  Having diabetes. What are the signs or symptoms? There are no symptoms of this condition. How is this diagnosed? This condition may be diagnosed based on the results of a blood test.  If you are older than 56 years of age, your health care provider may check your cholesterol levels every 4-6 years.  You may be checked more often if you have high cholesterol or other risk factors for heart disease. The blood test for cholesterol measures:  "Bad" cholesterol, or LDL cholesterol. This is the main type of cholesterol that causes heart disease. The desired level is less than 100 mg/dL.  "Good" cholesterol, or HDL cholesterol. HDL helps protect against heart disease by cleaning the arteries and carrying the LDL to the liver for processing. The desired level for HDL is 60 mg/dL or higher.  Triglycerides. These are fats that your body can store or burn for energy. The desired level is less than 150 mg/dL.  Total cholesterol. This measures the total amount of cholesterol in your blood and includes LDL, HDL, and triglycerides. The desired level is less than 200 mg/dL. How is this treated? This condition may be treated with:  Diet changes. You may be asked to eat foods that have more fiber and less  saturated fats or added sugar.  Lifestyle changes. These may include regular exercise, maintaining a healthy weight, and quitting use of tobacco products.  Medicines. These are given when diet and lifestyle changes have not worked. You may be prescribed a statin medicine to help lower your cholesterol levels. Follow these instructions at home: Eating and drinking  Eat a healthy, balanced diet. This diet includes: ? Daily servings of a variety of fresh, frozen, or canned fruits and vegetables. ? Daily servings of whole grain foods that are rich in fiber. ? Foods that are  low in saturated fats and trans fats. These include poultry and fish without skin, lean cuts of meat, and low-fat dairy products. ? A variety of fish, especially oily fish that contain omega-3 fatty acids. Aim to eat fish at least 2 times a week.  Avoid foods and drinks that have added sugar.  Use healthy cooking methods, such as roasting, grilling, broiling, baking, poaching, steaming, and stir-frying. Do not fry your food except for stir-frying.   Lifestyle  Get regular exercise. Aim to exercise for a total of 150 minutes a week. Increase your activity level by doing activities such as gardening, walking, and taking the stairs.  Do not use any products that contain nicotine or tobacco, such as cigarettes, e-cigarettes, and chewing tobacco. If you need help quitting, ask your health care provider.   General instructions  Take over-the-counter and prescription medicines only as told by your health care provider.  Keep all follow-up visits as told by your health care provider. This is important. Where to find more information  American Heart Association: www.heart.org  National Heart, Lung, and Blood Institute: PopSte[employer].is Contact a health care provider if:  You have trouble achieving or maintaining a healthy diet or weight.  You are starting an exercise program.  You are unable to stop smoking. Get help  right away if:  You have chest pain.  You have trouble breathing.  You have any symptoms of a stroke. "BE FAST" is an easy way to remember the main warning signs of a stroke: ? B - Balance. Signs are dizziness, sudden trouble walking, or loss of balance. ? E - Eyes. Signs are trouble seeing or a sudden change in vision. ? F - Face. Signs are sudden weakness or numbness of the face, or the face or eyelid drooping on one side. ? A - Arms. Signs are weakness or numbness in an arm. This happens suddenly and usually on one side of the body. ? S - Speech. Signs are sudden trouble speaking, slurred speech, or trouble understanding what people say. ? T - Time. Time to call emergency services. Write down what time symptoms started.  You have other signs of a stroke, such as: ? A sudden, severe headache with no known cause. ? Nausea or vomiting. ? Seizure. These symptoms may represent a serious problem that is an emergency. Do not wait to see if the symptoms will go away. Get medical help right away. Call your local emergency services (911 in the [place] Do not drive yourself to the hospital. Summary  Cholesterol plaques increase your risk for heart attack and stroke. Work with your health care provider to keep your cholesterol levels in a healthy range.  Eat a healthy, balanced diet, get regular exercise, and maintain a healthy weight.  Do not use any products that contain nicotine or tobacco, such as cigarettes, e-cigarettes, and chewing tobacco.  Get help right away if you have any symptoms of a stroke. This information is not intended to replace advice given to you by your health care provider. Make sure you discuss any questions you have with your health care provider. Document Revised: 07/28/2019 Document Reviewed: 07/28/2019 Elsevier Patient Education  2021 [employer]  Shoulder Exercises Ask your health care provider which exercises are safe for you. Do exercises exactly as told by  your health care provider and adjust them as directed. It is normal to feel mild stretching, pulling, tightness, or discomfort as you do these exercises. Stop right away if you feel sudden  pain or your pain gets worse. Do not begin these exercises until told by your health care provider. Stretching exercises External rotation and abduction This exercise is sometimes called corner stretch. This exercise rotates your arm outward (external rotation) and moves your arm out from your body (abduction). 1. Stand in a doorway with one of your feet slightly in front of the other. This is called a staggered stance. If you cannot reach your forearms to the door frame, stand facing a corner of a room. 2. Choose one of the following positions as told by your health care provider: ? Place your hands and forearms on the door frame above your head. ? Place your hands and forearms on the door frame at the height of your head. ? Place your hands on the door frame at the height of your elbows. 3. Slowly move your weight onto your front foot until you feel a stretch across your chest and in the front of your shoulders. Keep your head and chest upright and keep your abdominal muscles tight. 4. Hold for __________ seconds. 5. To release the stretch, shift your weight to your back foot. Repeat __________ times. Complete this exercise __________ times a day.   Extension, standing 1. Stand and hold a broomstick, a cane, or a similar object behind your back. ? Your hands should be a little wider than shoulder width apart. ? Your palms should face away from your back. 2. Keeping your elbows straight and your shoulder muscles relaxed, move the stick away from your body until you feel a stretch in your shoulders (extension). ? Avoid shrugging your shoulders while you move the stick. Keep your shoulder blades tucked down toward the middle of your back. 3. Hold for __________ seconds. 4. Slowly return to the starting  position. Repeat __________ times. Complete this exercise __________ times a day. Range-of-motion exercises Pendulum 1. Stand near a wall or a surface that you can hold onto for balance. 2. Bend at the waist and let your left / right arm hang straight down. Use your other arm to support you. Keep your back straight and do not lock your knees. 3. Relax your left / right arm and shoulder muscles, and move your hips and your trunk so your left / right arm swings freely. Your arm should swing because of the motion of your body, not because you are using your arm or shoulder muscles. 4. Keep moving your hips and trunk so your arm swings in the following directions, as told by your health care provider: ? Side to side. ? Forward and backward. ? In clockwise and counterclockwise circles. 5. Continue each motion for __________ seconds, or for as long as told by your health care provider. 6. Slowly return to the starting position. Repeat __________ times. Complete this exercise __________ times a day.   Shoulder flexion, standing 1. Stand and hold a broomstick, a cane, or a similar object. Place your hands a little more than shoulder width apart on the object. Your left / right hand should be palm up, and your other hand should be palm down. 2. Keep your elbow straight and your shoulder muscles relaxed. Push the stick up with your healthy arm to raise your left / right arm in front of your body, and then over your head until you feel a stretch in your shoulder (flexion). ? Avoid shrugging your shoulder while you raise your arm. Keep your shoulder blade tucked down toward the middle of your back. 3. Hold  for __________ seconds. 4. Slowly return to the starting position. Repeat __________ times. Complete this exercise __________ times a day.   Shoulder abduction, standing 1. Stand and hold a broomstick, a cane, or a similar object. Place your hands a little more than shoulder width apart on the object.  Your left / right hand should be palm up, and your other hand should be palm down. 2. Keep your elbow straight and your shoulder muscles relaxed. Push the object across your body toward your left / right side. Raise your left / right arm to the side of your body (abduction) until you feel a stretch in your shoulder. ? Do not raise your arm above shoulder height unless your health care provider tells you to do that. ? If directed, raise your arm over your head. ? Avoid shrugging your shoulder while you raise your arm. Keep your shoulder blade tucked down toward the middle of your back. 3. Hold for __________ seconds. 4. Slowly return to the starting position. Repeat __________ times. Complete this exercise __________ times a day. Internal rotation 1. Place your left / right hand behind your back, palm up. 2. Use your other hand to dangle an exercise band, a towel, or a similar object over your shoulder. Grasp the band with your left / right hand so you are holding on to both ends. 3. Gently pull up on the band until you feel a stretch in the front of your left / right shoulder. The movement of your arm toward the center of your body is called internal rotation. ? Avoid shrugging your shoulder while you raise your arm. Keep your shoulder blade tucked down toward the middle of your back. 4. Hold for __________ seconds. 5. Release the stretch by letting go of the band and lowering your hands. Repeat __________ times. Complete this exercise __________ times a day.   Strengthening exercises External rotation 1. Sit in a stable chair without armrests. 2. Secure an exercise band to a stable object at elbow height on your left / right side. 3. Place a soft object, such as a folded towel or a small pillow, between your left / right upper arm and your body to move your elbow about 4 inches (10 cm) away from your side. 4. Hold the end of the exercise band so it is tight and there is no slack. 5. Keeping  your elbow pressed against the soft object, slowly move your forearm out, away from your abdomen (external rotation). Keep your body steady so only your forearm moves. 6. Hold for __________ seconds. 7. Slowly return to the starting position. Repeat __________ times. Complete this exercise __________ times a day.   Shoulder abduction 1. Sit in a stable chair without armrests, or stand up. 2. Hold a __________ weight in your left / right hand, or hold an exercise band with both hands. 3. Start with your arms straight down and your left / right palm facing in, toward your body. 4. Slowly lift your left / right hand out to your side (abduction). Do not lift your hand above shoulder height unless your health care provider tells you that this is safe. ? Keep your arms straight. ? Avoid shrugging your shoulder while you do this movement. Keep your shoulder blade tucked down toward the middle of your back. 5. Hold for __________ seconds. 6. Slowly lower your arm, and return to the starting position. Repeat __________ times. Complete this exercise __________ times a day.   Shoulder extension 1.  Sit in a stable chair without armrests, or stand up. 2. Secure an exercise band to a stable object in front of you so it is at shoulder height. 3. Hold one end of the exercise band in each hand. Your palms should face each other. 4. Straighten your elbows and lift your hands up to shoulder height. 5. Step back, away from the secured end of the exercise band, until the band is tight and there is no slack. 6. Squeeze your shoulder blades together as you pull your hands down to the sides of your thighs (extension). Stop when your hands are straight down by your sides. Do not let your hands go behind your body. 7. Hold for __________ seconds. 8. Slowly return to the starting position. Repeat __________ times. Complete this exercise __________ times a day. Shoulder row 1. Sit in a stable chair without armrests, or  stand up. 2. Secure an exercise band to a stable object in front of you so it is at waist height. 3. Hold one end of the exercise band in each hand. Position your palms so that your thumbs are facing the ceiling (neutral position). 4. Bend each of your elbows to a 90-degree angle (right angle) and keep your upper arms at your sides. 5. Step back until the band is tight and there is no slack. 6. Slowly pull your elbows back behind you. 7. Hold for __________ seconds. 8. Slowly return to the starting position. Repeat __________ times. Complete this exercise __________ times a day. Shoulder press-ups 1. Sit in a stable chair that has armrests. Sit upright, with your feet flat on the floor. 2. Put your hands on the armrests so your elbows are bent and your fingers are pointing forward. Your hands should be about even with the sides of your body. 3. Push down on the armrests and use your arms to lift yourself off the chair. Straighten your elbows and lift yourself up as much as you comfortably can. ? Move your shoulder blades down, and avoid letting your shoulders move up toward your ears. ? Keep your feet on the ground. As you get stronger, your feet should support less of your body weight as you lift yourself up. 4. Hold for __________ seconds. 5. Slowly lower yourself back into the chair. Repeat __________ times. Complete this exercise __________ times a day.   Wall push-ups 1. Stand so you are facing a stable wall. Your feet should be about one arm-length away from the wall. 2. Lean forward and place your palms on the wall at shoulder height. 3. Keep your feet flat on the floor as you bend your elbows and lean forward toward the wall. 4. Hold for __________ seconds. 5. Straighten your elbows to push yourself back to the starting position. Repeat __________ times. Complete this exercise __________ times a day.   This information is not intended to replace advice given to you by your health  care provider. Make sure you discuss any questions you have with your health care provider. Document Revised: 12/20/2018 Document Reviewed: 09/27/2018 Elsevier Patient Education  2021 [employer]  Shoulder Range of Motion Exercises Shoulder range of motion (ROM) exercises are done to keep the shoulder moving freely or to increase movement. They are often recommended for people who have shoulder pain or stiffness or who are recovering from a shoulder surgery. Phase 1 exercises When you are able, do this exercise 1-2 times per day for 30-60 seconds in each direction, or as directed by your  health care provider. Pendulum exercise To do this exercise while sitting: 6. Sit in a chair or at the edge of your bed with your feet flat on the floor. 7. Let your affected arm hang down in front of you over the edge of the bed or chair. 8. Relax your shoulder, arm, and hand. 9. Rock your body so your arm gently swings in small circles. You can also use your unaffected arm to start the motion. 10. Repeat changing the direction of the circles, swinging your arm left and right, and swinging your arm forward and back. To do this exercise while standing: 5. Stand next to a sturdy chair or table, and hold on to it with your hand on your unaffected side. 6. Bend forward at the waist. 7. Bend your knees slightly. 8. Relax your shoulder, arm, and hand. 9. While keeping your shoulder relaxed, use body motion to swing your arm in small circles. 10. Repeat changing the direction of the circles, swinging your arm left and right, and swinging your arm forward and back. 11. Between exercises, stand up tall and take a short break to relax your lower back.   Phase 2 exercises Do these exercises 1-2 times per day or as told by your health care provider. Hold each stretch for 30 seconds, and repeat 3 times. Do the exercises with one or both arms as instructed by your health care provider. For these exercises, sit at a  table with your hand and arm supported by the table. A chair that slides easily or has wheels can be helpful. External rotation 7. Turn your chair so that your affected side is nearest to the table. 8. Place your forearm on the table to your side. Bend your elbow about 90 at the elbow (right angle) and place your hand palm facing down on the table. Your elbow should be about 6 inches away from your side. 9. Keeping your arm on the table, lean your body forward. Abduction 5. Turn your chair so that your affected side is nearest to the table. 6. Place your forearm and hand on the table so that your thumb points toward the ceiling and your arm is straight out to your side. 7. Slide your hand out to the side and away from you, using your unaffected arm to do the work. 8. To increase the stretch, you can slide your chair away from the table. Flexion: forward stretch 5. Sit facing the table. Place your hand and elbow on the table in front of you. 6. Slide your hand forward and away from you, using your unaffected arm to do the work. 7. To increase the stretch, you can slide your chair backward. Phase 3 exercises Do these exercises 1-2 times per day or as told by your health care provider. Hold each stretch for 30 seconds, and repeat 3 times. Do the exercises with one or both arms as instructed by your health care provider. Cross-body stretch: posterior capsule stretch 6. Lift your arm straight out in front of you. 7. Bend your arm 90 at the elbow (right angle) so your forearm moves across your body. 8. Use your other arm to gently pull the elbow across your body, toward your other shoulder. Wall climbs 8. Stand with your affected arm extended out to the side with your hand resting on a door frame. 9. Slide your hand slowly up the door frame. 10. To increase the stretch, step through the door frame. Keep your body upright and do  not lean. Wand exercises You will need a cane, a piece of PVC pipe,  or a sturdy wooden dowel for wand exercises. Flexion To do this exercise while standing: 7. Hold the wand with both of your hands, palms down. 8. Using the other arm to help, lift your arms up and over your head, if able. 9. Push upward with your other arm to gently increase the stretch. To do this exercise while lying down: 9. Lie on your back with your elbows resting on the floor and the wand in both your hands. Your hands will be palm down, or pointing toward your feet. 10. Lift your hands toward the ceiling, using your unaffected arm to help if needed. 11. Bring your arms overhead as able, using your unaffected arm to help if needed. Internal rotation 9. Stand while holding the wand behind you with both hands. Your unaffected arm should be extended above your head with the arm of the affected side extended behind you at the level of your waist. The wand should be pointing straight up and down as you hold it. 10. Slowly pull the wand up behind your back by straightening the elbow of your unaffected arm and bending the elbow of your affected arm. External rotation 6. Lie on your back with your affected upper arm supported on a small pillow or rolled towel. When you first do this exercise, keep your upper arm close to your body. Over time, bring your arm up to a 90 angle out to the side. 7. Hold the wand across your stomach and with both hands palm up. Your elbow on your affected side should be bent at a 90 angle. 8. Use your unaffected side to help push your forearm away from you and toward the floor. Keep your elbow on your affected side bent at a 90 angle. Contact a health care provider if you have:  New or increasing pain.  New numbness, tingling, weakness, or discoloration in your arm or hand. This information is not intended to replace advice given to you by your health care provider. Make sure you discuss any questions you have with your health care provider. Document Revised:  10/10/2017 Document Reviewed: 10/10/2017 Elsevier Patient Education  2021 Elsevier Inc.  Zoster Vaccine, Recombinant injection What is this medicine? ZOSTER VACCINE (ZOS ter vak SEEN) is a vaccine used to reduce the risk of getting shingles. This vaccine is not used to treat shingles or nerve pain from shingles. This medicine may be used for other purposes; ask your health care provider or pharmacist if you have questions. COMMON BRAND NAME(S): [facility] What should I tell my health care provider before I take this medicine? They need to know if you have any of these conditions:  cancer  immune system problems  an unusual or allergic reaction to Zoster vaccine, other medications, foods, dyes, or preservatives  pregnant or trying to get pregnant  breast-feeding How should I use this medicine? This vaccine is injected into a muscle. It is given by a health care provider. A copy of Vaccine Information Statements will be given before each vaccination. Be sure to read this information carefully each time. This sheet may change often. Talk to your health care provider about the use of this vaccine in children. This vaccine is not approved for use in children. Overdosage: If you think you have taken too much of this medicine contact a poison control center or emergency room at once. NOTE: This medicine is only for you. Do  not share this medicine with others. What if I miss a dose? Keep appointments for follow-up (booster) doses. It is important not to miss your dose. Call your health care provider if you are unable to keep an appointment. What may interact with this medicine?  medicines that suppress your immune system  medicines to treat cancer  steroid medicines like prednisone or cortisone This list may not describe all possible interactions. Give your health care provider a list of all the medicines, herbs, non-prescription drugs, or dietary supplements you use. Also tell them if you  smoke, drink alcohol, or use illegal drugs. Some items may interact with your medicine. What should I watch for while using this medicine? Visit your health care provider regularly. This vaccine, like all vaccines, may not fully protect everyone. What side effects may I notice from receiving this medicine? Side effects that you should report to your doctor or health care professional as soon as possible:  allergic reactions (skin rash, itching or hives; swelling of the face, lips, or tongue)  trouble breathing Side effects that usually do not require medical attention (report these to your doctor or health care professional if they continue or are bothersome):  chills  headache  fever  nausea  pain, redness, or irritation at site where injected  tiredness  vomiting This list may not describe all possible side effects. Call your doctor for medical advice about side effects. You may report side effects to FDA at 1-800-FDA-1088. Where should I keep my medicine? This vaccine is only given by a health care provider. It will not be stored at home. NOTE: This sheet is a summary. It may not cover all possible information. If you have questions about this medicine, talk to your doctor, pharmacist, or health care provider.  2021 Elsevier/Gold Standard (2019-10-03 16:23:07)

## 2020-10-06 ENCOUNTER — Encounter: Payer: Self-pay | Admitting: Internal Medicine

## 2020-10-08 ENCOUNTER — Other Ambulatory Visit: Payer: Self-pay | Admitting: Internal Medicine

## 2020-10-08 DIAGNOSIS — E039 Hypothyroidism, unspecified: Secondary | ICD-10-CM

## 2020-10-08 DIAGNOSIS — E890 Postprocedural hypothyroidism: Secondary | ICD-10-CM

## 2020-10-08 DIAGNOSIS — E0590 Thyrotoxicosis, unspecified without thyrotoxic crisis or storm: Secondary | ICD-10-CM

## 2020-10-08 MED ORDER — LEVOTHYROXINE SODIUM 75 MCG PO TABS: 75.0000 ug | ORAL_TABLET | Freq: Every day | ORAL | 3 refills | Status: DC

## 2021-02-10 LAB — HM COLONOSCOPY

## 2021-03-14 ENCOUNTER — Encounter: Payer: Self-pay | Admitting: Internal Medicine

## 2021-09-27 ENCOUNTER — Ambulatory Visit (INDEPENDENT_AMBULATORY_CARE_PROVIDER_SITE_OTHER): Payer: BC Managed Care – PPO | Admitting: Internal Medicine

## 2021-09-27 ENCOUNTER — Other Ambulatory Visit: Payer: Self-pay

## 2021-09-27 ENCOUNTER — Encounter: Payer: Self-pay | Admitting: Internal Medicine

## 2021-09-27 VITALS — BP 124/80 | HR 96 | Temp 97.6°F | Ht 63.98 in | Wt 131.0 lb

## 2021-09-27 DIAGNOSIS — E785 Hyperlipidemia, unspecified: Secondary | ICD-10-CM | POA: Diagnosis not present

## 2021-09-27 DIAGNOSIS — Z0000 Encounter for general adult medical examination without abnormal findings: Secondary | ICD-10-CM | POA: Diagnosis not present

## 2021-09-27 DIAGNOSIS — Z1389 Encounter for screening for other disorder: Secondary | ICD-10-CM

## 2021-09-27 DIAGNOSIS — E0590 Thyrotoxicosis, unspecified without thyrotoxic crisis or storm: Secondary | ICD-10-CM

## 2021-09-27 DIAGNOSIS — E039 Hypothyroidism, unspecified: Secondary | ICD-10-CM

## 2021-09-27 DIAGNOSIS — E538 Deficiency of other specified B group vitamins: Secondary | ICD-10-CM

## 2021-09-27 DIAGNOSIS — E890 Postprocedural hypothyroidism: Secondary | ICD-10-CM

## 2021-09-27 DIAGNOSIS — H6121 Impacted cerumen, right ear: Secondary | ICD-10-CM

## 2021-09-27 DIAGNOSIS — Z1283 Encounter for screening for malignant neoplasm of skin: Secondary | ICD-10-CM

## 2021-09-27 DIAGNOSIS — Z23 Encounter for immunization: Secondary | ICD-10-CM

## 2021-09-27 DIAGNOSIS — Z1231 Encounter for screening mammogram for malignant neoplasm of breast: Secondary | ICD-10-CM

## 2021-09-27 DIAGNOSIS — H6691 Otitis media, unspecified, right ear: Secondary | ICD-10-CM

## 2021-09-27 DIAGNOSIS — E559 Vitamin D deficiency, unspecified: Secondary | ICD-10-CM

## 2021-09-27 MED ORDER — SHINGRIX 50 MCG/0.5ML IM SUSR: 0.5000 mL | Freq: Once | INTRAMUSCULAR | 1 refills | Status: AC

## 2021-09-27 MED ORDER — OFLOXACIN 0.3 % OT SOLN: 5.0000 [drp] | Freq: Every day | OTIC | 0 refills | Status: DC

## 2021-09-27 MED ORDER — LEVOTHYROXINE SODIUM 75 MCG PO TABS: 75.0000 ug | ORAL_TABLET | Freq: Every day | ORAL | 3 refills | Status: DC

## 2021-09-27 NOTE — Addendum Note (Signed)
Addended by: Orland Mustard on: 09/27/2021 02:17 PM   Modules accepted: Orders

## 2021-09-27 NOTE — Patient Instructions (Addendum)
Consider CT scan of lungs screening for lung cancer  [facility] or web MD  Consider shingrix x 2 doses Zoster Vaccine, Recombinant injection What is this medication? ZOSTER VACCINE (ZOS ter vak SEEN) is a vaccine used to reduce the risk of getting shingles. This vaccine is not used to treat shingles or nerve pain from shingles. This medicine may be used for other purposes; ask your health care provider or pharmacist if you have questions. COMMON BRAND NAME(S): [facility] What should I tell my care team before I take this medication? They need to know if you have any of these conditions: cancer immune system problems an unusual or allergic reaction to Zoster vaccine, other medications, foods, dyes, or preservatives pregnant or trying to get pregnant breast-feeding How should I use this medication? This vaccine is injected into a muscle. It is given by a health care provider. A copy of Vaccine Information Statements will be given before each vaccination. Be sure to read this information carefully each time. This sheet may change often. Talk to your health care provider about the use of this vaccine in children. This vaccine is not approved for use in children. Overdosage: If you think you have taken too much of this medicine contact a poison control center or emergency room at once. NOTE: This medicine is only for you. Do not share this medicine with others. What if I miss a dose? Keep appointments for follow-up (booster) doses. It is important not to miss your dose. Call your health care provider if you are unable to keep an appointment. What may interact with this medication? medicines that suppress your immune system medicines to treat cancer steroid medicines like prednisone or cortisone This list may not describe all possible interactions. Give your health care provider a list of all the medicines, herbs, non-prescription drugs, or dietary supplements you use. Also tell them if you  smoke, drink alcohol, or use illegal drugs. Some items may interact with your medicine. What should I watch for while using this medication? Visit your health care provider regularly. This vaccine, like all vaccines, may not fully protect everyone. What side effects may I notice from receiving this medication? Side effects that you should report to your doctor or health care professional as soon as possible: allergic reactions (skin rash, itching or hives; swelling of the face, lips, or tongue) trouble breathing Side effects that usually do not require medical attention (report these to your doctor or health care professional if they continue or are bothersome): chills headache fever nausea pain, redness, or irritation at site where injected tiredness vomiting This list may not describe all possible side effects. Call your doctor for medical advice about side effects. You may report side effects to FDA at 1-800-FDA-1088. Where should I keep my medication? This vaccine is only given by a health care provider. It will not be stored at home. NOTE: This sheet is a summary. It may not cover all possible information. If you have questions about this medicine, talk to your doctor, pharmacist, or health care provider.  2022 Elsevier/Gold Standard (2021-05-17 00:00:00)  Think about this one  Pneumococcal Conjugate Vaccine (Prevnar 13) Suspension for Injection What is this medication? PNEUMOCOCCAL VACCINE (NEU mo KOK al vak SEEN) is a vaccine used to prevent pneumococcus bacterial infections. These bacteria can cause serious infections like pneumonia, meningitis, and blood infections. This vaccine will lower your chance of getting pneumonia. If you do get pneumonia, it can make your symptoms milder and your illness shorter.  This vaccine will not treat an infection and will not cause infection. This vaccine is recommended for infants and young children, adults with certain medical conditions, and  adults 65 years or older. This medicine may be used for other purposes; ask your health care provider or pharmacist if you have questions. COMMON BRAND NAME(S): Prevnar, Prevnar 13 What should I tell my care team before I take this medication? They need to know if you have any of these conditions: bleeding problems fever immune system problems an unusual or allergic reaction to pneumococcal vaccine, diphtheria toxoid, other vaccines, latex, other medicines, foods, dyes, or preservatives pregnant or trying to get pregnant breast-feeding How should I use this medication? This vaccine is for injection into a muscle. It is given by a health care professional. A copy of Vaccine Information Statements will be given before each vaccination. Read this sheet carefully each time. The sheet may change frequently. Talk to your pediatrician regarding the use of this medicine in children. While this drug may be prescribed for children as young as 10 weeks old for selected conditions, precautions do apply. Overdosage: If you think you have taken too much of this medicine contact a poison control center or emergency room at once. NOTE: This medicine is only for you. Do not share this medicine with others. What if I miss a dose? It is important not to miss your dose. Call your doctor or health care professional if you are unable to keep an appointment. What may interact with this medication? medicines for cancer chemotherapy medicines that suppress your immune function steroid medicines like prednisone or cortisone This list may not describe all possible interactions. Give your health care provider a list of all the medicines, herbs, non-prescription drugs, or dietary supplements you use. Also tell them if you smoke, drink alcohol, or use illegal drugs. Some items may interact with your medicine. What should I watch for while using this medication? Mild fever and pain should go away in 3 days or less. Report  any unusual symptoms to your doctor or health care professional. What side effects may I notice from receiving this medication? Side effects that you should report to your doctor or health care professional as soon as possible: allergic reactions like skin rash, itching or hives, swelling of the face, lips, or tongue breathing problems confused fast or irregular heartbeat fever over 102 degrees F seizures unusual bleeding or bruising unusual muscle weakness Side effects that usually do not require medical attention (report to your doctor or health care professional if they continue or are bothersome): aches and pains diarrhea fever of 102 degrees F or less headache irritable loss of appetite pain, tender at site where injected trouble sleeping This list may not describe all possible side effects. Call your doctor for medical advice about side effects. You may report side effects to FDA at 1-800-FDA-1088. Where should I keep my medication? This does not apply. This vaccine is given in a clinic, pharmacy, doctor's office, or other health care setting and will not be stored at home. NOTE: This sheet is a summary. It may not cover all possible information. If you have questions about this medicine, talk to your doctor, pharmacist, or health care provider.  2022 Elsevier/Gold Standard (2014-06-04 00:00:00)

## 2021-09-27 NOTE — Progress Notes (Signed)
Chief Complaint  Patient presents with   Annual Exam   Annual  1. Hypothyroidism on levo will sch fastin glabs    Review of Systems  Constitutional:  Negative for weight loss.  HENT:  Negative for hearing loss.   Eyes:  Negative for blurred vision.  Respiratory:  Negative for shortness of breath.   Cardiovascular:  Negative for chest pain.  Gastrointestinal:  Negative for abdominal pain and blood in stool.  Genitourinary:  Negative for dysuria.  Musculoskeletal:  Negative for falls and joint pain.  Skin:  Negative for rash.  Neurological:  Negative for headaches.  Psychiatric/Behavioral:  Negative for depression.   Past Medical History:  Diagnosis Date   Anxiety    COVID-19    08/05/20   Hyperlipidemia    Thyroid disease    graves tx'ed now with hypothyroidism    Tobacco abuse    Past Surgical History:  Procedure Laterality Date   BREAST BIOPSY Right early 2000s   benign   BREAST SURGERY Right 2000   biopsy - benign   OVARIAN CYST REMOVAL     SALPINGOOPHORECTOMY Right 2009   ovarian cyst   Family History  Problem Relation Age of Onset   Heart disease Father 57       CAD    Cancer Father        lung, smoker    Cancer Sister        cervical cancer   Hypertension Paternal Grandmother    Social History   Socioeconomic History   Marital status: Married    Spouse name: Economist   Number of children: 1   Years of education: 12   Highest education level: Not on file  Occupational History   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 She graduated high school from [employer]. She lives in [place] with her Crissie Figures has a 57 year old daughter who has 3 kids. 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 She enjoys camping, boating, riding horses. 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 2 step grandkids, 3 grandkids with daughter   Social Determinants of Health   Financial Resource Strain: Not on file  Food Insecurity: Not on file  Transportation Needs: Not on file  Physical Activity: Not on file  Stress: Not on file  Social Connections: Not on file  Intimate Partner Violence: Not on file   Current Meds  Medication Sig   ibuprofen (ADVIL,MOTRIN) 200 MG tablet Take 400 mg by mouth every 6 (six) hours as needed.   Zoster Vaccine Adjuvanted [facility]) injection Inject 0.5 mLs into the muscle once for 1 dose. 2 doses   [DISCONTINUED] levothyroxine (SYNTHROID) 75 MCG tablet Take 1 tablet (75 mcg total) by  mouth daily before breakfast. 30 minutes before food.   No Known Allergies No results found for this or any previous visit (from the past 2160 hour(s)). Objective  Body mass index is 22.5 kg/m. Wt Readings from Last 3 Encounters:  09/27/21 131 lb (59.4 kg)  09/24/20 128 lb 6.4 oz (58.2 kg)  07/07/20 128 lb (58.1 kg)   Temp Readings from Last 3 Encounters:  09/27/21 97.6 F (36.4 C) (Temporal)  09/24/20 98.3 F (36.8 C) (Oral)  06/12/18 98.6 F (37 C) (Oral)   BP Readings from Last 3 Encounters:  09/27/21 124/80  09/24/20 118/80  07/07/20 120/80   Pulse Readings from Last 3 Encounters:  09/27/21 96  09/24/20 87  07/01/19 93    Physical Exam Vitals and nursing note reviewed.  Constitutional:      Appearance: Normal appearance. She is well-developed and well-groomed.  HENT:     Head: Normocephalic and atraumatic.      Right Ear: There is impacted cerumen.  Eyes:     Conjunctiva/sclera: Conjunctivae normal.     Pupils: Pupils are equal, round, and reactive to light.  Cardiovascular:     Rate and Rhythm: Normal rate and regular rhythm.     Heart sounds: Normal heart sounds. No murmur heard. Pulmonary:     Effort: Pulmonary effort is normal.     Breath sounds: Normal breath sounds.  Abdominal:     General: Abdomen is flat. Bowel sounds are normal.     Tenderness: There is no abdominal tenderness.  Musculoskeletal:        General: No tenderness.  Skin:    General: Skin is warm and dry.  Neurological:     General: No focal deficit present.     Mental Status: She is alert and oriented to person, place, and time. Mental status is at baseline.     Cranial Nerves: Cranial nerves 2-12 are intact.     Gait: Gait is intact.  Psychiatric:        Attention and Perception: Attention and perception normal.        Mood and Affect: Mood and affect normal.        Speech: Speech normal.        Behavior: Behavior normal. Behavior is cooperative.        Thought Content: Thought content normal.        Cognition and Memory: Cognition and memory normal.        Judgment: Judgment normal.    Assessment  Plan  Annual physical exam - Plan: Comprehensive metabolic panel, Lipid panel, CBC with Differential/Platelet, TSH, Urinalysis, Routine w reflex microscopic See below   Hypothyroidism, unspecified type - Plan: TSH, levothyroxine (SYNTHROID) 75 MCG tablet   Screening mammogram, encounter for - Plan: MM 3D SCREEN BREAST BILATERAL  Need for shingles vaccine - Plan: Zoster Vaccine Adjuvanted [facility]) injection  Postablative hypothyroidism - Plan: levothyroxine (SYNTHROID) 75 MCG tablet Subclinical hyperthyroidism - Plan: levothyroxine (SYNTHROID) 75 MCG tablet  [facility] Skin cancer screening - Plan: Ambulatory referral to Dermatology  sch fasting labs  Flu, prevnar shot declines  covid shot consider  pfizer Tdap utd MMR immune  shingrix shot disc consider in future   mammo 02/27/18 negative rec schedule Ordered   Pap had 2017 [facility] due 2020 s/p right ovary and FT removal only  -pap 09/30/15 neg pap neg hpv -referred Dr. Georgianne Fick 07/07/20 negative due 06/2023    02/10/21 hyperplastic polyp  [facility] 10 years   Skin referred [facility] today   rec smoking cessation smoking since teens 1 ppd   Cerumen impaction right  Pt agreeable and consented to procedure  Lavage with colace, hp, and warm water  All wax removed     Provider: Dr. Olivia Mackie McLean-Scocuzza-[facility]

## 2021-09-27 NOTE — Progress Notes (Signed)
Patient presented for ear irrigation due to cerumen impaction. Patient was informed of the possible side effects of having their ear flushed; light headedness, dizziness, nausea, vomiting and rupture ear drum. Items sed or that can be used are the elephant pump, catch basin, ear curettes, hydrogen peroxide (half a bottle for ear irrigation solution), and a stool softener (1-2CC for softening ear wax). Patient has given a verbal consent to have ear irrigation. patient voiced no concerns nor showed any signs of distress during procedure. Ear canal has become visibly clear  

## 2021-10-06 ENCOUNTER — Other Ambulatory Visit (INDEPENDENT_AMBULATORY_CARE_PROVIDER_SITE_OTHER): Payer: BC Managed Care – PPO

## 2021-10-06 ENCOUNTER — Other Ambulatory Visit: Payer: Self-pay

## 2021-10-06 DIAGNOSIS — Z0000 Encounter for general adult medical examination without abnormal findings: Secondary | ICD-10-CM

## 2021-10-06 DIAGNOSIS — E538 Deficiency of other specified B group vitamins: Secondary | ICD-10-CM | POA: Diagnosis not present

## 2021-10-06 DIAGNOSIS — E559 Vitamin D deficiency, unspecified: Secondary | ICD-10-CM

## 2021-10-06 DIAGNOSIS — E785 Hyperlipidemia, unspecified: Secondary | ICD-10-CM

## 2021-10-06 DIAGNOSIS — Z1389 Encounter for screening for other disorder: Secondary | ICD-10-CM

## 2021-10-06 DIAGNOSIS — E039 Hypothyroidism, unspecified: Secondary | ICD-10-CM

## 2021-10-06 LAB — CBC WITH DIFFERENTIAL/PLATELET
Basophils Absolute: 0.1 10*3/uL (ref 0.0–0.1)
Basophils Relative: 0.8 % (ref 0.0–3.0)
Eosinophils Absolute: 0.1 10*3/uL (ref 0.0–0.7)
Eosinophils Relative: 1.5 % (ref 0.0–5.0)
HCT: 42.1 % (ref 36.0–46.0)
Hemoglobin: 13.9 g/dL (ref 12.0–15.0)
Lymphocytes Relative: 30.6 % (ref 12.0–46.0)
Lymphs Abs: 2.6 10*3/uL (ref 0.7–4.0)
MCHC: 32.9 g/dL (ref 30.0–36.0)
MCV: 95.6 fl (ref 78.0–100.0)
Monocytes Absolute: 0.7 10*3/uL (ref 0.1–1.0)
Monocytes Relative: 8 % (ref 3.0–12.0)
Neutro Abs: 4.9 10*3/uL (ref 1.4–7.7)
Neutrophils Relative %: 59.1 % (ref 43.0–77.0)
Platelets: 222 10*3/uL (ref 150.0–400.0)
RBC: 4.4 Mil/uL (ref 3.87–5.11)
RDW: 13.3 % (ref 11.5–15.5)
WBC: 8.3 10*3/uL (ref 4.0–10.5)

## 2021-10-06 LAB — LIPID PANEL
Cholesterol: 230 mg/dL — ABNORMAL HIGH (ref 0–200)
HDL: 54 mg/dL (ref >39.00)
LDL Cholesterol: 146 mg/dL — ABNORMAL HIGH (ref 0–99)
NonHDL: 176.28
Total CHOL/HDL Ratio: 4
Triglycerides: 153 mg/dL — ABNORMAL HIGH (ref 0.0–149.0)
VLDL: 30.6 mg/dL (ref 0.0–40.0)

## 2021-10-06 LAB — COMPREHENSIVE METABOLIC PANEL
ALT: 12 U/L (ref 0–35)
AST: 14 U/L (ref 0–37)
Albumin: 4.4 g/dL (ref 3.5–5.2)
Alkaline Phosphatase: 76 U/L (ref 39–117)
BUN: 13 mg/dL (ref 6–23)
CO2: 28 mEq/L (ref 19–32)
Calcium: 9.6 mg/dL (ref 8.4–10.5)
Chloride: 103 mEq/L (ref 96–112)
Creatinine, Ser: 0.76 mg/dL (ref 0.40–1.20)
GFR: 87.64 mL/min (ref >60.00)
Glucose, Bld: 84 mg/dL (ref 70–99)
Potassium: 4.4 mEq/L (ref 3.5–5.1)
Sodium: 139 mEq/L (ref 135–145)
Total Bilirubin: 0.5 mg/dL (ref 0.2–1.2)
Total Protein: 7 g/dL (ref 6.0–8.3)

## 2021-10-06 LAB — VITAMIN D 25 HYDROXY (VIT D DEFICIENCY, FRACTURES): VITD: 38.23 ng/mL (ref 30.00–100.00)

## 2021-10-06 LAB — VITAMIN B12: Vitamin B-12: 868 pg/mL (ref 211–911)

## 2021-10-06 LAB — TSH: TSH: 1.4 u[IU]/mL (ref 0.35–5.50)

## 2021-10-07 LAB — URINALYSIS, ROUTINE W REFLEX MICROSCOPIC
Bilirubin Urine: NEGATIVE
Glucose, UA: NEGATIVE
Hgb urine dipstick: NEGATIVE
Ketones, ur: NEGATIVE
Leukocytes,Ua: NEGATIVE
Nitrite: NEGATIVE
Protein, ur: NEGATIVE
Specific Gravity, Urine: 1.003 (ref 1.001–1.035)
pH: 7 (ref 5.0–8.0)

## 2021-11-02 ENCOUNTER — Other Ambulatory Visit: Payer: Self-pay | Admitting: Internal Medicine

## 2021-11-02 DIAGNOSIS — E0590 Thyrotoxicosis, unspecified without thyrotoxic crisis or storm: Secondary | ICD-10-CM

## 2021-11-02 DIAGNOSIS — E039 Hypothyroidism, unspecified: Secondary | ICD-10-CM

## 2021-11-02 DIAGNOSIS — E890 Postprocedural hypothyroidism: Secondary | ICD-10-CM

## 2022-02-09 ENCOUNTER — Ambulatory Visit: Payer: BC Managed Care – PPO | Admitting: Dermatology

## 2022-07-27 ENCOUNTER — Telehealth: Payer: Self-pay | Admitting: Internal Medicine

## 2022-07-27 NOTE — Telephone Encounter (Signed)
Pt need refill on levothyroxine sent to [facility]

## 2022-07-31 ENCOUNTER — Other Ambulatory Visit: Payer: Self-pay

## 2022-07-31 DIAGNOSIS — E0590 Thyrotoxicosis, unspecified without thyrotoxic crisis or storm: Secondary | ICD-10-CM

## 2022-07-31 DIAGNOSIS — E890 Postprocedural hypothyroidism: Secondary | ICD-10-CM

## 2022-07-31 DIAGNOSIS — E039 Hypothyroidism, unspecified: Secondary | ICD-10-CM

## 2022-07-31 MED ORDER — LEVOTHYROXINE SODIUM 75 MCG PO TABS: ORAL_TABLET | ORAL | 3 refills | Status: DC

## 2022-07-31 NOTE — Telephone Encounter (Signed)
Refill has been sent documentation has been sent to [facility]

## 2022-07-31 NOTE — Telephone Encounter (Signed)
Pt called needing a refill on levothyroxine 75 mcg.  Pt has a TOC appt 11/27/22.  Pt last saw Dr. French Ana 09/27/21.  Refill was sent.

## 2022-10-06 ENCOUNTER — Encounter: Payer: BC Managed Care – PPO | Admitting: Internal Medicine

## 2022-11-26 NOTE — Patient Instructions (Incomplete)
It was a pleasure meeting you today. Thank you for allowing me to take part in your health care.  Our goals for today as we discussed include:  Recommend Shingles vaccine.  This is a 2 dose series and can be given at your local pharmacy.  Please talk to your pharmacist about this.   Total cholesterol was high at 230 (nml is <200) and LDL (bad) cholesterol was 146 which is also elevated (nml is <99).  You can decrease these numbers by: -limiting your intake of processed foods and foods high in saturated fats.  -increasing your physical activity -increasing your intake of vegetables, healthy fish (bluefin tuna, wild salmon, and sardines), whole grains and fiber rich foods .   -You can also use extra virgin olive oil instead of butter. -If you smoke, quitting can decrease LDL cholesterol and raise HDL cholesterol.  We can recheck your cholesterol in 3 months, if you continue to have elevated numbers we will discuss medication options at that time.      If you have any questions or concerns, please do not hesitate to call the office at [phone].  I look forward to our next visit and until then take care and stay safe.  Regards,   Carollee Leitz, MD   [facility]

## 2022-11-26 NOTE — Progress Notes (Signed)
   SUBJECTIVE:  No chief complaint on file.  HPI Presents to clinic to transfer care  Hypothyroid Currently on levothyroxine 50 mcg daily.  Asymptomatic.  Hyperlipidemia  Tobacco use 1 pack/day since teens.  PERTINENT PMH / PSH: Hypothyroid Hyperlipidemia Tobacco use   OBJECTIVE:  LMP 10/22/2016 Comment: ,neg serum preg test 11-01-16   Physical Exam  ASSESSMENT/PLAN:  There are no diagnoses linked to this encounter. PDMP reviewed***  No follow-ups on file.  Carollee Leitz, MD

## 2022-11-27 ENCOUNTER — Encounter: Payer: Self-pay | Admitting: Family Medicine

## 2022-11-27 ENCOUNTER — Ambulatory Visit: Payer: BC Managed Care – PPO | Admitting: Family Medicine

## 2022-11-27 VITALS — BP 120/70 | HR 88 | Temp 98.6°F | Ht 64.5 in | Wt 122.0 lb

## 2022-11-27 DIAGNOSIS — E782 Mixed hyperlipidemia: Secondary | ICD-10-CM

## 2022-11-27 DIAGNOSIS — Z23 Encounter for immunization: Secondary | ICD-10-CM | POA: Diagnosis not present

## 2022-11-27 DIAGNOSIS — Z1231 Encounter for screening mammogram for malignant neoplasm of breast: Secondary | ICD-10-CM | POA: Diagnosis not present

## 2022-11-27 DIAGNOSIS — Z122 Encounter for screening for malignant neoplasm of respiratory organs: Secondary | ICD-10-CM

## 2022-11-27 DIAGNOSIS — R7309 Other abnormal glucose: Secondary | ICD-10-CM

## 2022-11-27 DIAGNOSIS — Z720 Tobacco use: Secondary | ICD-10-CM

## 2022-11-27 DIAGNOSIS — E039 Hypothyroidism, unspecified: Secondary | ICD-10-CM | POA: Diagnosis not present

## 2022-11-27 DIAGNOSIS — H7191 Unspecified cholesteatoma, right ear: Secondary | ICD-10-CM

## 2022-11-27 NOTE — Assessment & Plan Note (Signed)
Chronic.  Stable.  Continue levothyroxine 75 mcg daily Check TSH

## 2022-11-27 NOTE — Assessment & Plan Note (Signed)
Check A1c. 

## 2022-11-27 NOTE — Assessment & Plan Note (Signed)
Chronic.  42 pack year history.  Meets criteria for LDCT Offered low-dose CT for lung cancer screening.  Patient politely declined at this time. Continue to encourage smoking cessation

## 2022-11-27 NOTE — Assessment & Plan Note (Signed)
Incidental finding.  Denies any hearing loss, drainage from ear.  Endorses intermittent sensation of ear fullness and intermittent dizziness has been ongoing for years.  Reports at last visit had ear syringe for earwax and treated with ofloxacin drops. Concern for cholesteatoma and would appreciate evaluation from ENT

## 2022-11-27 NOTE — Assessment & Plan Note (Signed)
Chronic.  Not currently on statin therapy. Check fasting lipids

## 2022-12-29 ENCOUNTER — Other Ambulatory Visit (INDEPENDENT_AMBULATORY_CARE_PROVIDER_SITE_OTHER): Payer: BC Managed Care – PPO

## 2022-12-29 DIAGNOSIS — E782 Mixed hyperlipidemia: Secondary | ICD-10-CM | POA: Diagnosis not present

## 2022-12-29 DIAGNOSIS — E039 Hypothyroidism, unspecified: Secondary | ICD-10-CM

## 2022-12-29 DIAGNOSIS — R7309 Other abnormal glucose: Secondary | ICD-10-CM

## 2022-12-29 LAB — COMPREHENSIVE METABOLIC PANEL
ALT: 12 U/L (ref 0–35)
AST: 15 U/L (ref 0–37)
Albumin: 4.5 g/dL (ref 3.5–5.2)
Alkaline Phosphatase: 74 U/L (ref 39–117)
BUN: 16 mg/dL (ref 6–23)
CO2: 27 mEq/L (ref 19–32)
Calcium: 9.5 mg/dL (ref 8.4–10.5)
Chloride: 104 mEq/L (ref 96–112)
Creatinine, Ser: 0.93 mg/dL (ref 0.40–1.20)
GFR: 68.2 mL/min (ref >60.00)
Glucose, Bld: 93 mg/dL (ref 70–99)
Potassium: 4.4 mEq/L (ref 3.5–5.1)
Sodium: 139 mEq/L (ref 135–145)
Total Bilirubin: 0.3 mg/dL (ref 0.2–1.2)
Total Protein: 7.1 g/dL (ref 6.0–8.3)

## 2022-12-29 LAB — CBC WITH DIFFERENTIAL/PLATELET
Basophils Absolute: 0.1 10*3/uL (ref 0.0–0.1)
Basophils Relative: 0.9 % (ref 0.0–3.0)
Eosinophils Absolute: 0.2 10*3/uL (ref 0.0–0.7)
Eosinophils Relative: 2.7 % (ref 0.0–5.0)
HCT: 42.8 % (ref 36.0–46.0)
Hemoglobin: 14.3 g/dL (ref 12.0–15.0)
Lymphocytes Relative: 35.7 % (ref 12.0–46.0)
Lymphs Abs: 2.9 10*3/uL (ref 0.7–4.0)
MCHC: 33.4 g/dL (ref 30.0–36.0)
MCV: 96.1 fl (ref 78.0–100.0)
Monocytes Absolute: 0.8 10*3/uL (ref 0.1–1.0)
Monocytes Relative: 9.5 % (ref 3.0–12.0)
Neutro Abs: 4.1 10*3/uL (ref 1.4–7.7)
Neutrophils Relative %: 51.2 % (ref 43.0–77.0)
Platelets: 242 10*3/uL (ref 150.0–400.0)
RBC: 4.45 Mil/uL (ref 3.87–5.11)
RDW: 13.9 % (ref 11.5–15.5)
WBC: 8.1 10*3/uL (ref 4.0–10.5)

## 2022-12-29 LAB — HEMOGLOBIN A1C: Hgb A1c MFr Bld: 5.7 % (ref 4.6–6.5)

## 2022-12-29 LAB — LIPID PANEL
Cholesterol: 200 mg/dL (ref 0–200)
HDL: 53.4 mg/dL (ref >39.00)
LDL Cholesterol: 126 mg/dL — ABNORMAL HIGH (ref 0–99)
NonHDL: 147.01
Total CHOL/HDL Ratio: 4
Triglycerides: 107 mg/dL (ref 0.0–149.0)
VLDL: 21.4 mg/dL (ref 0.0–40.0)

## 2022-12-29 LAB — TSH: TSH: 1.77 u[IU]/mL (ref 0.35–5.50)

## 2023-01-03 ENCOUNTER — Ambulatory Visit (INDEPENDENT_AMBULATORY_CARE_PROVIDER_SITE_OTHER): Payer: BC Managed Care – PPO | Admitting: Family Medicine

## 2023-01-03 ENCOUNTER — Encounter: Payer: Self-pay | Admitting: Family Medicine

## 2023-01-03 VITALS — BP 124/80 | HR 90 | Temp 98.6°F | Ht 64.5 in | Wt 122.8 lb

## 2023-01-03 DIAGNOSIS — Z0000 Encounter for general adult medical examination without abnormal findings: Secondary | ICD-10-CM

## 2023-01-03 DIAGNOSIS — Z720 Tobacco use: Secondary | ICD-10-CM

## 2023-01-03 DIAGNOSIS — E785 Hyperlipidemia, unspecified: Secondary | ICD-10-CM

## 2023-01-03 DIAGNOSIS — R7303 Prediabetes: Secondary | ICD-10-CM

## 2023-01-03 DIAGNOSIS — Z23 Encounter for immunization: Secondary | ICD-10-CM | POA: Diagnosis not present

## 2023-01-03 DIAGNOSIS — H7191 Unspecified cholesteatoma, right ear: Secondary | ICD-10-CM

## 2023-01-03 NOTE — Progress Notes (Signed)
SUBJECTIVE:   Chief Complaint  Patient presents with   Annual Exam   HPI Patient presents to clinic for annual physical and second shingles vaccine.  No acute concerns today.  Tobacco use 41 pack year walking history.  No previous attempt of smoking cessation.  Has not had low-dose CT for screening lung cancer.  Hyperlipidemia Recent LDL greater than 126.  Not currently on statin therapy. 10-year ASCVD risk 5.3%, borderline.  Discussed cardiac CT.  Patient considering.  ENT scheduled tomorrow for evaluation of cholesteatoma of right ear.   PERTINENT PMH / PSH: Graves' disease Hypothyroid status post ablation Tobacco use Hyperlipidemia Prediabetes Family history of CAD   OBJECTIVE:  BP 124/80   Pulse 90   Temp 98.6 F (37 C)   Ht 5' 4.5" (1.638 m)   Wt 122 lb 12.8 oz (55.7 kg)   LMP 10/22/2016 Comment: ,neg serum preg test 11-01-16  SpO2 99%   BMI 20.75 kg/m    Physical Exam Vitals reviewed.  Constitutional:      General: She is not in acute distress.    Appearance: She is normal weight. She is not ill-appearing.  HENT:     Head: Normocephalic.  Eyes:     Conjunctiva/sclera: Conjunctivae normal.  Neck:     Thyroid: No thyromegaly or thyroid tenderness.  Cardiovascular:     Rate and Rhythm: Normal rate and regular rhythm.     Pulses: Normal pulses.  Pulmonary:     Effort: Pulmonary effort is normal.     Breath sounds: Normal breath sounds.  Abdominal:     General: Bowel sounds are normal.  Neurological:     Mental Status: She is alert. Mental status is at baseline.  Psychiatric:        Mood and Affect: Mood normal.        Behavior: Behavior normal.        Thought Content: Thought content normal.        Judgment: Judgment normal.     ASSESSMENT/PLAN:  Annual physical exam Assessment & Plan: HCM Colonoscopy up-to-date.  Due 02/2031 Tdap up-to-date.  Due 06/2028 Mammogram due.  Referral previously sent.  Patient to schedule an  appointment. Last Pap 10/21.  NILM, HPV negative.  Due 10/24.  Patient to schedule for an appointment. Hepatitis C/HIV screening completed Shingrix vaccine today.  Second dose. Recommend pneumonia vaccine Recommend low-dose lung cancer screening.  Patient declined   Need for shingles vaccine Assessment & Plan: Received second dose Shingrix today.  Administered by CMA   Orders: -     Varicella-zoster vaccine IM  Tobacco abuse Assessment & Plan: Chronic.  41 pack year history.  Meets criteria for LDCT Offered low-dose CT for lung cancer screening.  Patient politely declined at this time. Continue to encourage smoking cessation    Hyperlipidemia, unspecified hyperlipidemia type Assessment & Plan: Chronic.  Not currently on statin therapy.  10-year ASCVD risk 5.3%, borderline risk. Recommend smoking cessation Recommend statin therapy.  Patient declined at this time Discussed calcium score.  Patient to consider and will decide at later date Family history of CAD.    Cholesteatoma of right ear Assessment & Plan: Incidental finding ENT appointment scheduled tomorrow for evaluation.   Prediabetes Assessment & Plan: History of abnormal glucose.  Recent A1c 5.7. BMI normal.   Monitor carbohydrate intake and increased activity      PDMP reviewed  Return in about 1 year (around 01/03/2024), or if symptoms worsen or fail to improve.  Kenney Houseman  Clent Ridges, MD

## 2023-01-03 NOTE — Patient Instructions (Addendum)
It was a pleasure meeting you today. Thank you for allowing me to take part in your health care.  Our goals for today as we discussed include:  Follow up in 1 year for annual visit  Let me know about calcium score if wanting referral  Follow up with ENT tomorrow.  Referral sent for Mammogram. Please call to schedule appointment. [facility] [address] [place], [place] [address] [phone]    Consider quitting smoking can call 1-800-QUITNOW  If you have any questions or concerns, please do not hesitate to call the office at [phone].  I look forward to our next visit and until then take care and stay safe.  Regards,   Dana Allan, MD   [facility]

## 2023-01-13 ENCOUNTER — Encounter: Payer: Self-pay | Admitting: Family Medicine

## 2023-01-13 DIAGNOSIS — R7303 Prediabetes: Secondary | ICD-10-CM | POA: Insufficient documentation

## 2023-01-13 NOTE — Assessment & Plan Note (Signed)
Incidental finding ENT appointment scheduled tomorrow for evaluation.

## 2023-01-13 NOTE — Assessment & Plan Note (Signed)
Chronic.  Not currently on statin therapy.  10-year ASCVD risk 5.3%, borderline risk. Recommend smoking cessation Recommend statin therapy.  Patient declined at this time Discussed calcium score.  Patient to consider and will decide at later date Family history of CAD.

## 2023-01-13 NOTE — Assessment & Plan Note (Signed)
HCM Colonoscopy up-to-date.  Due 02/2031 Tdap up-to-date.  Due 06/2028 Mammogram due.  Referral previously sent.  Patient to schedule an appointment. Last Pap 10/21.  NILM, HPV negative.  Due 10/24.  Patient to schedule for an appointment. Hepatitis C/HIV screening completed Shingrix vaccine today.  Second dose. Recommend pneumonia vaccine Recommend low-dose lung cancer screening.  Patient declined

## 2023-01-13 NOTE — Assessment & Plan Note (Addendum)
History of abnormal glucose.  Recent A1c 5.7. BMI normal.   Monitor carbohydrate intake and increased activity

## 2023-01-13 NOTE — Assessment & Plan Note (Signed)
Chronic.  41 pack year history.  Meets criteria for LDCT Offered low-dose CT for lung cancer screening.  Patient politely declined at this time. Continue to encourage smoking cessation

## 2023-01-13 NOTE — Assessment & Plan Note (Signed)
Received second dose Shingrix today.  Administered by CMA

## 2023-06-07 ENCOUNTER — Telehealth (INDEPENDENT_AMBULATORY_CARE_PROVIDER_SITE_OTHER): Payer: BC Managed Care – PPO | Admitting: Nurse Practitioner

## 2023-06-07 ENCOUNTER — Encounter: Payer: Self-pay | Admitting: Nurse Practitioner

## 2023-06-07 VITALS — Temp 99.8°F

## 2023-06-07 DIAGNOSIS — J0140 Acute pansinusitis, unspecified: Secondary | ICD-10-CM

## 2023-06-07 MED ORDER — DOXYCYCLINE HYCLATE 100 MG PO TABS: 100.0000 mg | ORAL_TABLET | Freq: Two times a day (BID) | ORAL | 0 refills | Status: AC

## 2023-06-07 MED ORDER — PREDNISONE 10 MG PO TABS: ORAL_TABLET | ORAL | 0 refills | Status: DC

## 2023-06-07 NOTE — Progress Notes (Unsigned)
Virtual Visit via Video Note  I connected with Megan Poole on 06/07/2023 at 1:53 PM by a video enabled telemedicine application and verified that I am speaking with the correct person using two identifiers.  Patient Location: Home Provider Location: Office/Clinic  I discussed the limitations, risks, security, and privacy concerns of performing an evaluation and management service by video and the availability of in person appointments. I also discussed with the patient that there may be a patient responsible charge related to this service. The patient expressed understanding and agreed to proceed.  Subjective: PCP: Dana Allan, MD  Chief Complaint  Patient presents with   Acute Visit    Sinus pressure on the left side of her head Gums on upper left side dull pain  Low grade fever 99.8 x 4 days   HPI Patient is seen due to possible sinus infection.  Patient states she has been experiencing left-sided sinus pressure pain.  States has been experiencing frontal headaches and maxillary tenderness.  Also reports left-sided gum pain.  Low-grade fever. Medication: Ibuprofen  ROS: Per HPI  Current Outpatient Medications:    doxycycline (VIBRA-TABS) 100 MG tablet, Take 1 tablet (100 mg total) by mouth 2 (two) times daily for 10 days., Disp: 20 tablet, Rfl: 0   ibuprofen (ADVIL,MOTRIN) 200 MG tablet, Take 400 mg by mouth every 6 (six) hours as needed., Disp: , Rfl:    levothyroxine (SYNTHROID) 75 MCG tablet, TAKE 1 TABLET BY MOUTH DAILY 30 MINUTES BEFORE BREAKFAST, Disp: 90 tablet, Rfl: 3   predniSONE (DELTASONE) 10 MG tablet, Take 4 tablets ( total 40 mg) by mouth for 2 days; take 3 tablets ( total 30 mg) by mouth for 2 days; take 2 tablets ( total 20 mg) by mouth for 1 day; take 1 tablet ( total 10 mg) by mouth for 1 day., Disp: 17 tablet, Rfl: 0  Observations/Objective: Today's Vitals   06/07/23 1327  Temp: 99.8 F (37.7 C)   Physical Exam Constitutional:      General: She  is not in acute distress.    Appearance: Normal appearance. She is not ill-appearing.  Eyes:     Conjunctiva/sclera: Conjunctivae normal.  Pulmonary:     Effort: No respiratory distress.  Neurological:     Mental Status: She is alert.  Psychiatric:        Mood and Affect: Mood normal.        Behavior: Behavior normal.        Thought Content: Thought content normal.        Judgment: Judgment normal.     Assessment and Plan: Acute non-recurrent pansinusitis Assessment & Plan: Symptoms consistent with Sinusitis.  Will treat with doxycycline and prednisone tapering. Advised patient use steam and humidifier. Increase fluid intake. Patient will let us know if symptoms not improving.    Other orders -     Doxycycline Hyclate; Take 1 tablet (100 mg total) by mouth 2 (two) times daily for 10 days.  Dispense: 20 tablet; Refill: 0 -     predniSONE; Take 4 tablets ( total 40 mg) by mouth for 2 days; take 3 tablets ( total 30 mg) by mouth for 2 days; take 2 tablets ( total 20 mg) by mouth for 1 day; take 1 tablet ( total 10 mg) by mouth for 1 day.  Dispense: 17 tablet; Refill: 0    Follow Up Instructions: No follow-ups on file.   I discussed the assessment and treatment plan with the patient. The  patient was provided an opportunity to ask questions, and all were answered. The patient agreed with the plan and demonstrated an understanding of the instructions.   The patient was advised to call back or seek an in-person evaluation if the symptoms worsen or if the condition fails to improve as anticipated.  The above assessment and management plan was discussed with the patient. The patient verbalized understanding of and has agreed to the management plan.   Kara Dies, NP

## 2023-06-11 DIAGNOSIS — J0140 Acute pansinusitis, unspecified: Secondary | ICD-10-CM | POA: Insufficient documentation

## 2023-06-11 NOTE — Assessment & Plan Note (Addendum)
Symptoms consistent with Sinusitis.  Will treat with doxycycline and prednisone tapering. Advised patient use steam and humidifier. Increase fluid intake. Patient will let us know if symptoms not improving.

## 2023-10-25 ENCOUNTER — Other Ambulatory Visit: Payer: Self-pay

## 2023-10-25 DIAGNOSIS — E039 Hypothyroidism, unspecified: Secondary | ICD-10-CM

## 2023-10-25 DIAGNOSIS — E890 Postprocedural hypothyroidism: Secondary | ICD-10-CM

## 2023-10-25 DIAGNOSIS — E0590 Thyrotoxicosis, unspecified without thyrotoxic crisis or storm: Secondary | ICD-10-CM

## 2023-10-25 MED ORDER — LEVOTHYROXINE SODIUM 75 MCG PO TABS
ORAL_TABLET | ORAL | 3 refills | Status: DC
Start: 2023-10-25 — End: 2024-01-13

## 2024-01-04 ENCOUNTER — Encounter: Payer: Self-pay | Admitting: Family Medicine

## 2024-01-04 ENCOUNTER — Ambulatory Visit (INDEPENDENT_AMBULATORY_CARE_PROVIDER_SITE_OTHER): Payer: 59 | Admitting: Family Medicine

## 2024-01-04 VITALS — BP 116/66 | HR 83 | Temp 98.1°F | Ht 64.5 in | Wt 124.4 lb

## 2024-01-04 DIAGNOSIS — Z0000 Encounter for general adult medical examination without abnormal findings: Secondary | ICD-10-CM

## 2024-01-04 DIAGNOSIS — E559 Vitamin D deficiency, unspecified: Secondary | ICD-10-CM | POA: Diagnosis not present

## 2024-01-04 DIAGNOSIS — E538 Deficiency of other specified B group vitamins: Secondary | ICD-10-CM | POA: Diagnosis not present

## 2024-01-04 DIAGNOSIS — E782 Mixed hyperlipidemia: Secondary | ICD-10-CM | POA: Diagnosis not present

## 2024-01-04 DIAGNOSIS — E039 Hypothyroidism, unspecified: Secondary | ICD-10-CM

## 2024-01-04 DIAGNOSIS — E890 Postprocedural hypothyroidism: Secondary | ICD-10-CM

## 2024-01-04 DIAGNOSIS — Z122 Encounter for screening for malignant neoplasm of respiratory organs: Secondary | ICD-10-CM

## 2024-01-04 DIAGNOSIS — R7309 Other abnormal glucose: Secondary | ICD-10-CM

## 2024-01-04 DIAGNOSIS — Z720 Tobacco use: Secondary | ICD-10-CM

## 2024-01-04 DIAGNOSIS — E0590 Thyrotoxicosis, unspecified without thyrotoxic crisis or storm: Secondary | ICD-10-CM

## 2024-01-04 DIAGNOSIS — Z1231 Encounter for screening mammogram for malignant neoplasm of breast: Secondary | ICD-10-CM

## 2024-01-04 LAB — COMPREHENSIVE METABOLIC PANEL WITH GFR
ALT: 12 U/L (ref 0–35)
AST: 16 U/L (ref 0–37)
Albumin: 4.6 g/dL (ref 3.5–5.2)
Alkaline Phosphatase: 71 U/L (ref 39–117)
BUN: 11 mg/dL (ref 6–23)
CO2: 28 meq/L (ref 19–32)
Calcium: 9.6 mg/dL (ref 8.4–10.5)
Chloride: 101 meq/L (ref 96–112)
Creatinine, Ser: 0.73 mg/dL (ref 0.40–1.20)
GFR: 90.55 mL/min (ref >60.00)
Glucose, Bld: 94 mg/dL (ref 70–99)
Potassium: 4.4 meq/L (ref 3.5–5.1)
Sodium: 136 meq/L (ref 135–145)
Total Bilirubin: 0.7 mg/dL (ref 0.2–1.2)
Total Protein: 7.3 g/dL (ref 6.0–8.3)

## 2024-01-04 LAB — TSH: TSH: 0.6 u[IU]/mL (ref 0.35–5.50)

## 2024-01-04 LAB — CBC WITH DIFFERENTIAL/PLATELET
Basophils Absolute: 0 10*3/uL (ref 0.0–0.1)
Basophils Relative: 0.6 % (ref 0.0–3.0)
Eosinophils Absolute: 0.1 10*3/uL (ref 0.0–0.7)
Eosinophils Relative: 1.3 % (ref 0.0–5.0)
HCT: 42 % (ref 36.0–46.0)
Hemoglobin: 13.9 g/dL (ref 12.0–15.0)
Lymphocytes Relative: 26.4 % (ref 12.0–46.0)
Lymphs Abs: 2.1 10*3/uL (ref 0.7–4.0)
MCHC: 33.1 g/dL (ref 30.0–36.0)
MCV: 98.1 fl (ref 78.0–100.0)
Monocytes Absolute: 0.6 10*3/uL (ref 0.1–1.0)
Monocytes Relative: 8 % (ref 3.0–12.0)
Neutro Abs: 5.2 10*3/uL (ref 1.4–7.7)
Neutrophils Relative %: 63.7 % (ref 43.0–77.0)
Platelets: 238 10*3/uL (ref 150.0–400.0)
RBC: 4.28 Mil/uL (ref 3.87–5.11)
RDW: 13.7 % (ref 11.5–15.5)
WBC: 8.1 10*3/uL (ref 4.0–10.5)

## 2024-01-04 LAB — HEMOGLOBIN A1C: Hgb A1c MFr Bld: 5.6 % (ref 4.6–6.5)

## 2024-01-04 LAB — LIPID PANEL
Cholesterol: 198 mg/dL (ref 0–200)
HDL: 60.7 mg/dL (ref >39.00)
LDL Cholesterol: 120 mg/dL — ABNORMAL HIGH (ref 0–99)
NonHDL: 137.5
Total CHOL/HDL Ratio: 3
Triglycerides: 87 mg/dL (ref 0.0–149.0)
VLDL: 17.4 mg/dL (ref 0.0–40.0)

## 2024-01-04 LAB — VITAMIN B12: Vitamin B-12: 228 pg/mL (ref 211–911)

## 2024-01-04 LAB — VITAMIN D 25 HYDROXY (VIT D DEFICIENCY, FRACTURES): VITD: 55.92 ng/mL (ref 30.00–100.00)

## 2024-01-04 NOTE — Patient Instructions (Addendum)
 It was a pleasure meeting you today. Thank you for allowing me to take part in your health care.  Our goals for today as we discussed include:  We will get some labs today.  If they are abnormal or we need to do something about them, I will call you.  If they are normal, I will send you a message on MyChart (if it is active) or a letter in the mail.  If you don't hear from us  in 2 weeks, please call the office at the number below.   Will send in refill for medication when labs reviewed.  Referral sent for Mammogram. Please call to schedule appointment. [facility] [address] [place], [place] [address] [phone]    Call [facility] to schedule PAP.  Due 06/2024  Referral sent to pulmonology for lung screening  Recommend Pneumonia 20 vaccine.  No other vaccines needed for pneumonia if receive this.  This is a list of the screening recommended for you and due dates:  Health Maintenance  Topic Date Due   Pneumococcal Vaccination (1 of 2 - PCV) Never done   Screening for Lung Cancer  Never done   Mammogram  02/28/2020   COVID-19 Vaccine (1 - 2024-25 season) Never done   Flu Shot  04/11/2024   Pap with HPV screening  07/07/2025   DTaP/Tdap/Td vaccine (2 - Td or Tdap) 06/12/2028   Colon Cancer Screening  02/11/2031   Hepatitis C Screening  Completed   HIV Screening  Completed   Zoster (Shingles) Vaccine  Completed   HPV Vaccine  Aged Out   Meningitis B Vaccine  Aged Out     If you have any questions or concerns, please do not hesitate to call the office at [phone].  I look forward to our next visit and until then take care and stay safe.  Regards,   Valli Gaw, MD   [facility]

## 2024-01-04 NOTE — Progress Notes (Signed)
 SUBJECTIVE:   Chief Complaint  Patient presents with   Medication Refill   HPI Presents for follow up chronic disease management  Discussed the use of AI scribe software for clinical note transcription with the patient, who gave verbal consent to proceed.  History of Present Illness Megan Poole is a 58 year old female with hypothyroidism who presents for a medication refill and routine follow-up.  She is currently taking thyroid  medication at a dose of 75 mcg and has enough medication to last for one to two months. She is awaiting lab results to determine if any adjustments are needed. No issues with chest pain or shortness of breath. No problems with constipation or diarrhea, except for occasional dietary-related disturbances.  She has a significant smoking history, smoking one pack per day. She previously declined a low-dose CT screening for lung health. She is due for a mammogram and a Pap smear, with the latter scheduled for October. She has had the shingles vaccine and discussed the option of the pneumonia vaccine due to increased risk from smoking.  She reports being busy at home with activities such as cleaning and pressure washing, which led her to take a muscle relaxer due to overexertion.    PERTINENT PMH / PSH: As above  OBJECTIVE:  BP 116/66 (Cuff Size: Normal)   Pulse 83   Temp 98.1 F (36.7 C) (Oral)   Ht 5' 4.5" (1.638 m)   Wt 124 lb 6 oz (56.4 kg)   LMP 10/22/2016 Comment: ,neg serum preg test 11-01-16  SpO2 96%   BMI 21.02 kg/m    Physical Exam Vitals reviewed.  Constitutional:      General: She is not in acute distress.    Appearance: She is not ill-appearing.  HENT:     Head: Normocephalic.     Right Ear: Tympanic membrane, ear canal and external ear normal.     Left Ear: Tympanic membrane, ear canal and external ear normal.     Nose: Nose normal.     Mouth/Throat:     Mouth: Mucous membranes are moist.  Eyes:     Extraocular Movements:  Extraocular movements intact.     Conjunctiva/sclera: Conjunctivae normal.     Pupils: Pupils are equal, round, and reactive to light.  Neck:     Thyroid : No thyromegaly or thyroid  tenderness.     Vascular: No carotid bruit.  Cardiovascular:     Rate and Rhythm: Normal rate and regular rhythm.     Pulses: Normal pulses.     Heart sounds: Normal heart sounds.  Pulmonary:     Effort: Pulmonary effort is normal.     Breath sounds: Normal breath sounds.  Abdominal:     General: Bowel sounds are normal. There is no distension.     Palpations: Abdomen is soft.     Tenderness: There is no abdominal tenderness. There is no right CVA tenderness, left CVA tenderness, guarding or rebound.  Musculoskeletal:        General: Normal range of motion.     Cervical back: Normal range of motion.     Right lower leg: No edema.     Left lower leg: No edema.  Lymphadenopathy:     Cervical: No cervical adenopathy.  Skin:    Capillary Refill: Capillary refill takes less than 2 seconds.  Neurological:     General: No focal deficit present.     Mental Status: She is alert and oriented to person, place, and time. Mental  status is at baseline.     Motor: No weakness.  Psychiatric:        Mood and Affect: Mood normal.        Behavior: Behavior normal.        Thought Content: Thought content normal.        Judgment: Judgment normal.           01/04/2024   11:28 AM 06/07/2023    1:28 PM 11/27/2022    1:39 PM 09/27/2021    1:12 PM 09/24/2020    2:08 PM  Depression screen PHQ 2/9  Decreased Interest 0 0 0 0 0  Down, Depressed, Hopeless 0 0 0 0 0  PHQ - 2 Score 0 0 0 0 0  Altered sleeping 0 0   0  Tired, decreased energy 1 0   0  Change in appetite 0 0   0  Feeling bad or failure about yourself  0 0   0  Trouble concentrating 0 0   0  Moving slowly or fidgety/restless 0 0   0  Suicidal thoughts 0 0   0  PHQ-9 Score 1 0   0  Difficult doing work/chores Not difficult at all Not difficult at all    Not difficult at all      01/04/2024   11:29 AM 06/07/2023    1:29 PM 11/27/2022    1:39 PM 09/24/2020    2:08 PM  GAD 7 : Generalized Anxiety Score  Nervous, Anxious, on Edge 0 0 0 0  Control/stop worrying 0 0 0 0  Worry too much - different things 0 0 0 0  Trouble relaxing 0 0 0 0  Restless 0 0 0 0  Easily annoyed or irritable 0 0 0 0  Afraid - awful might happen 0 0 0 0  Total GAD 7 Score 0 0 0 0  Anxiety Difficulty Not difficult at all Not difficult at all Not difficult at all Not difficult at all    ASSESSMENT/PLAN:  Annual physical exam Assessment & Plan: Routine wellness visit to assess overall health and manage ongoing conditions. Emphasized regular screenings and vaccinations. - Order fasting labs  - Provide contact information for mammogram scheduling. - Recommend regular self breast exams - Recommend scheduling Pap smear for October 2026 - Discuss pneumococcal vaccination options, recommend PCV20 due to smoking history.     Postablative hypothyroidism Assessment & Plan: Well-managed on levothyroxine  75 mcg. Blood work to assess thyroid  function and determine need for adjustments. - Order thyroid  function tests. - Plan to refill levothyroxine  for one year pending lab results. - Communicate any necessary medication adjustments via MyChart.  Orders: -     TSH -     Levothyroxine  Sodium; TAKE 1 TABLET BY MOUTH DAILY 30 MINUTES BEFORE BREAKFAST  Dispense: 90 tablet; Refill: 3  Abnormal glucose -     Hemoglobin A1c  Mixed hyperlipidemia -     CBC with Differential/Platelet -     Comprehensive metabolic panel with GFR -     Lipid panel  Screening for lung cancer -     Ambulatory Referral for Lung Cancer Scre  Breast cancer screening by mammogram -     3D Screening Mammogram, Left and Right  Vitamin B 12 deficiency -     Vitamin B12  Vitamin D  deficiency -     VITAMIN D  25 Hydroxy (Vit-D Deficiency, Fractures)  Tobacco abuse Assessment & Plan: Ongoing  tobacco use with one pack per day.  Discussed risks and offered low-dose CT screening for lung cancer. Agreed on pulmonology referral. - Refer to pulmonology for low-dose CT screening for lung cancer. - Discuss smoking cessation options and benefits.   Subclinical hyperthyroidism -     Levothyroxine  Sodium; TAKE 1 TABLET BY MOUTH DAILY 30 MINUTES BEFORE BREAKFAST  Dispense: 90 tablet; Refill: 3  Hypothyroidism, unspecified type Assessment & Plan: Well-managed on levothyroxine  75 mcg. Blood work to assess thyroid  function and determine need for adjustments. - Order thyroid  function tests. - Plan to refill levothyroxine  for one year pending lab results. - Communicate any necessary medication adjustments via MyChart.  Orders: -     Levothyroxine  Sodium; TAKE 1 TABLET BY MOUTH DAILY 30 MINUTES BEFORE BREAKFAST  Dispense: 90 tablet; Refill: 3    PDMP reviewed  Return if symptoms worsen or fail to improve, for PCP.  Valli Gaw, MD

## 2024-01-13 ENCOUNTER — Encounter: Payer: Self-pay | Admitting: Family Medicine

## 2024-01-13 DIAGNOSIS — E538 Deficiency of other specified B group vitamins: Secondary | ICD-10-CM | POA: Insufficient documentation

## 2024-01-13 DIAGNOSIS — E559 Vitamin D deficiency, unspecified: Secondary | ICD-10-CM | POA: Insufficient documentation

## 2024-01-13 DIAGNOSIS — Z122 Encounter for screening for malignant neoplasm of respiratory organs: Secondary | ICD-10-CM | POA: Insufficient documentation

## 2024-01-13 MED ORDER — LEVOTHYROXINE SODIUM 75 MCG PO TABS
ORAL_TABLET | ORAL | 3 refills | Status: AC
Start: 2024-01-13 — End: ?

## 2024-01-13 NOTE — Assessment & Plan Note (Signed)
 Well-managed on levothyroxine  75 mcg. Blood work to assess thyroid  function and determine need for adjustments. - Order thyroid  function tests. - Plan to refill levothyroxine  for one year pending lab results. - Communicate any necessary medication adjustments via MyChart.

## 2024-01-13 NOTE — Assessment & Plan Note (Signed)
 Routine wellness visit to assess overall health and manage ongoing conditions. Emphasized regular screenings and vaccinations. - Order fasting labs  - Provide contact information for mammogram scheduling. - Recommend regular self breast exams - Recommend scheduling Pap smear for October 2026 - Discuss pneumococcal vaccination options, recommend PCV20 due to smoking history.

## 2024-01-13 NOTE — Assessment & Plan Note (Signed)
 Ongoing tobacco use with one pack per day. Discussed risks and offered low-dose CT screening for lung cancer. Agreed on pulmonology referral. - Refer to pulmonology for low-dose CT screening for lung cancer. - Discuss smoking cessation options and benefits.

## 2024-01-14 ENCOUNTER — Ambulatory Visit
Admission: RE | Admit: 2024-01-14 | Discharge: 2024-01-14 | Disposition: A | Discharge status: Home / Self Care | Source: Ambulatory Visit | Attending: Family Medicine | Admitting: Family Medicine

## 2024-01-14 DIAGNOSIS — Z1231 Encounter for screening mammogram for malignant neoplasm of breast: Secondary | ICD-10-CM | POA: Diagnosis present

## 2024-06-09 ENCOUNTER — Telehealth: Payer: Self-pay

## 2024-06-09 NOTE — Telephone Encounter (Signed)
 Lm to call office to move 12/31 appt to Oct. E2C2 please call the office and ask for Darice.  Thanks.

## 2024-06-10 NOTE — Telephone Encounter (Signed)
 Lm to call office and ask for Luke or Darice. Patient needs a Toc and would like to schedule next week if any openings.

## 2024-09-10 ENCOUNTER — Encounter
